# Patient Record
Sex: Male | Born: 1945 | Race: White | Hispanic: No | Marital: Married | Smoking: Former smoker
Health system: Southern US, Community
[De-identification: ages and names within clinical notes are randomized; demographics above are authoritative.]

## PROBLEM LIST (undated history)

## (undated) DIAGNOSIS — I350 Nonrheumatic aortic (valve) stenosis: Secondary | ICD-10-CM

## (undated) DIAGNOSIS — M1990 Unspecified osteoarthritis, unspecified site: Secondary | ICD-10-CM

## (undated) DIAGNOSIS — J189 Pneumonia, unspecified organism: Secondary | ICD-10-CM

## (undated) DIAGNOSIS — R011 Cardiac murmur, unspecified: Secondary | ICD-10-CM

## (undated) DIAGNOSIS — R7303 Prediabetes: Secondary | ICD-10-CM

## (undated) DIAGNOSIS — I10 Essential (primary) hypertension: Secondary | ICD-10-CM

## (undated) DIAGNOSIS — I2510 Atherosclerotic heart disease of native coronary artery without angina pectoris: Secondary | ICD-10-CM

## (undated) DIAGNOSIS — D649 Anemia, unspecified: Secondary | ICD-10-CM

## (undated) DIAGNOSIS — I714 Abdominal aortic aneurysm, without rupture, unspecified: Secondary | ICD-10-CM

## (undated) DIAGNOSIS — K219 Gastro-esophageal reflux disease without esophagitis: Secondary | ICD-10-CM

## (undated) HISTORY — PX: COLONOSCOPY W/ POLYPECTOMY: SHX1380

---
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
## 2015-10-23 ENCOUNTER — Other Ambulatory Visit: Payer: Self-pay | Admitting: Family Medicine

## 2015-10-23 DIAGNOSIS — Z8639 Personal history of other endocrine, nutritional and metabolic disease: Secondary | ICD-10-CM

## 2015-10-23 DIAGNOSIS — IMO0001 Reserved for inherently not codable concepts without codable children: Secondary | ICD-10-CM

## 2015-11-05 ENCOUNTER — Inpatient Hospital Stay: Admission: RE | Admit: 2015-11-05 | Payer: Self-pay | Source: Ambulatory Visit

## 2015-11-20 ENCOUNTER — Ambulatory Visit
Admission: RE | Admit: 2015-11-20 | Discharge: 2015-11-20 | Disposition: A | Discharge status: Home / Self Care | Payer: Medicare HMO | Source: Ambulatory Visit | Attending: Family Medicine | Admitting: Family Medicine

## 2015-11-20 DIAGNOSIS — Z8639 Personal history of other endocrine, nutritional and metabolic disease: Secondary | ICD-10-CM

## 2015-11-20 DIAGNOSIS — IMO0001 Reserved for inherently not codable concepts without codable children: Secondary | ICD-10-CM

## 2015-11-20 MED ORDER — IOPAMIDOL (ISOVUE-300) INJECTION 61%
100.0000 mL | Freq: Once | INTRAVENOUS | Status: AC | PRN
  Administered 2015-11-20: 100 mL via INTRAVENOUS

## 2017-01-25 ENCOUNTER — Other Ambulatory Visit: Payer: Self-pay | Admitting: Family Medicine

## 2017-01-25 DIAGNOSIS — I719 Aortic aneurysm of unspecified site, without rupture: Secondary | ICD-10-CM

## 2017-03-14 ENCOUNTER — Other Ambulatory Visit: Payer: Self-pay | Admitting: Family Medicine

## 2017-03-14 DIAGNOSIS — I714 Abdominal aortic aneurysm, without rupture, unspecified: Secondary | ICD-10-CM

## 2017-03-15 ENCOUNTER — Ambulatory Visit
Admission: RE | Admit: 2017-03-15 | Discharge: 2017-03-15 | Disposition: A | Discharge status: Home / Self Care | Payer: Medicare HMO | Source: Ambulatory Visit | Attending: Family Medicine | Admitting: Family Medicine

## 2017-03-15 ENCOUNTER — Other Ambulatory Visit: Payer: Medicare HMO

## 2017-03-15 DIAGNOSIS — I714 Abdominal aortic aneurysm, without rupture, unspecified: Secondary | ICD-10-CM

## 2017-03-15 MED ORDER — IOPAMIDOL (ISOVUE-300) INJECTION 61%
100.0000 mL | Freq: Once | INTRAVENOUS | Status: AC | PRN
  Administered 2017-03-15: 125 mL via INTRAVENOUS

## 2018-04-26 ENCOUNTER — Other Ambulatory Visit: Payer: Self-pay | Admitting: Family Medicine

## 2018-04-27 ENCOUNTER — Other Ambulatory Visit: Payer: Self-pay | Admitting: Family Medicine

## 2018-04-28 ENCOUNTER — Other Ambulatory Visit: Payer: Self-pay | Admitting: Family Medicine

## 2018-04-28 DIAGNOSIS — I714 Abdominal aortic aneurysm, without rupture, unspecified: Secondary | ICD-10-CM

## 2019-09-26 ENCOUNTER — Other Ambulatory Visit: Payer: Self-pay | Admitting: Family Medicine

## 2019-09-26 DIAGNOSIS — I714 Abdominal aortic aneurysm, without rupture, unspecified: Secondary | ICD-10-CM

## 2019-10-05 ENCOUNTER — Other Ambulatory Visit: Payer: Self-pay | Admitting: Family Medicine

## 2019-10-09 ENCOUNTER — Ambulatory Visit
Admission: RE | Admit: 2019-10-09 | Discharge: 2019-10-09 | Disposition: A | Discharge status: Home / Self Care | Payer: Medicare HMO | Source: Ambulatory Visit | Attending: Family Medicine | Admitting: Family Medicine

## 2019-10-09 DIAGNOSIS — I714 Abdominal aortic aneurysm, without rupture, unspecified: Secondary | ICD-10-CM

## 2020-09-09 DIAGNOSIS — I714 Abdominal aortic aneurysm, without rupture: Secondary | ICD-10-CM | POA: Diagnosis not present

## 2020-09-09 DIAGNOSIS — I10 Essential (primary) hypertension: Secondary | ICD-10-CM | POA: Diagnosis not present

## 2020-09-12 ENCOUNTER — Telehealth: Payer: Self-pay

## 2020-09-12 NOTE — Telephone Encounter (Signed)
NOTES ON FILE FROM  [facility] [phone] SENT REFERRAL TO SCHEDULING...Marland KitchenMarland Kitchen

## 2020-09-16 DIAGNOSIS — I714 Abdominal aortic aneurysm, without rupture: Secondary | ICD-10-CM | POA: Diagnosis not present

## 2020-09-16 NOTE — Progress Notes (Signed)
Cardiology Office Note:    Date:  09/17/2020   ID:  Marcus Webb, DOB 1945-08-08, MRN [id number]  PCP:  Aida Puffer, MD   [facility] Providers Cardiologist:  None {  Referring MD: Aida Puffer, MD    History of Present Illness:    Marcus Webb is a 75 y.o. male with a hx of AAA, Barretts esophagus and HTN who was referred by Dr. Clarene Duke for further evaluation of AAA and HTN.  The patient states he overall feels well. No chest pain, SOB, lightheadedness, dizziness, orthopnea or PND. Prior heavy smoker and quit in 2001. Has been monitored yearly for his AAA and it has been overall stable over the past 10-12 years. No personal history of heart disease, arrhythmia or CHF. Patient is overall active and is able to walk the dog and mow the lawn without issues.   The patient states his blood pressure has been running 160/90-100s in the AM and 130/80s in the evening. Takes all medications in the AM except the coreg which he takes BID. Notably is on both coreg and metoprolol at this time.   Family history: Father with HTN, AAA  No past medical history on file.   Current Medications: Current Meds  Medication Sig   aspirin EC 81 MG tablet Take 1 tablet (81 mg total) by mouth daily. Swallow whole.   carvedilol (COREG) 12.5 MG tablet Take 1 tablet (12.5 mg total) by mouth 2 (two) times daily.   lisinopril (ZESTRIL) 10 MG tablet Take 1 tablet (10 mg total) by mouth at bedtime.   pantoprazole (PROTONIX) 40 MG tablet Take 40 mg by mouth 2 (two) times daily.   [DISCONTINUED] carvedilol (COREG) 6.25 MG tablet Take 6.25 mg by mouth in the morning and at bedtime.   [DISCONTINUED] hydrochlorothiazide (HYDRODIURIL) 25 MG tablet Take 25 mg by mouth daily.   [DISCONTINUED] lisinopril (ZESTRIL) 5 MG tablet Take 5 mg by mouth daily.   [DISCONTINUED] metoprolol succinate (TOPROL-XL) 100 MG 24 hr tablet Take 100 mg by mouth daily.     Allergies:   Patient has no known allergies.   Social History    Socioeconomic History   Marital status: Married    Spouse name: Not on file   Number of children: Not on file   Years of education: Not on file   Highest education level: Not on file  Occupational History   Not on file  Tobacco Use   Smoking status: Former    Types: Cigarettes   Smokeless tobacco: Never  Substance and Sexual Activity   Alcohol use: Not on file   Drug use: Not on file   Sexual activity: Not on file  Other Topics Concern   Not on file  Social History Narrative   Not on file   Social Determinants of Health   Financial Resource Strain: Not on file  Food Insecurity: Not on file  Transportation Needs: Not on file  Physical Activity: Not on file  Stress: Not on file  Social Connections: Not on file     Family History: The patient's family history is not on file.  ROS:   Please see the history of present illness.    Review of Systems  Constitutional:  Negative for chills and fever.  HENT:  Negative for sore throat.   Eyes:  Negative for blurred vision.  Respiratory:  Negative for shortness of breath.   Cardiovascular:  Negative for chest pain, palpitations, orthopnea, claudication, leg swelling and PND.  Gastrointestinal:  Positive  for heartburn. Negative for nausea and vomiting.  Genitourinary:  Negative for hematuria.  Musculoskeletal:  Negative for falls.  Neurological:  Negative for dizziness and loss of consciousness.  Psychiatric/Behavioral:  Negative for substance abuse.   .  EKGs/Labs/Other Studies Reviewed:    The following studies were reviewed today: Abdominal CT Scan 8./2021 FINDINGS: Lower chest: Heart is normal in size. There mitral annulus calcifications. Probable aortic valvular calcifications. No focal airspace disease or pleural effusion.   Hepatobiliary: Mild hepatomegaly with liver spanning 20.9 cm cranial caudal. Diffusely decreased density consistent with steatosis. Suggestion of nodular hepatic contours. No discrete focal  hepatic lesion. No gallstone or pericholecystic inflammation. There is no biliary dilatation.   Pancreas: No ductal dilatation or inflammation.   Spleen: Normal in size without focal abnormality.   Adrenals/Urinary Tract: Bilateral adrenal thickening without dominant adrenal nodule. There is no hydronephrosis. 11 mm lesion from the anterior mid right kidney is too small to accurately characterize, grossly similar in size to prior exam. No renal calculi. Mild wall thickening of the urinary bladder with right lateral bladder diverticulum. No perivesicular fat stranding.   Stomach/Bowel: Small hiatal hernia. There is no small bowel obstruction or inflammation. Multifocal colonic diverticulosis throughout the entire colon, prominent involving the sigmoid. No evidence of diverticulitis. The appendix is not definitively visualized. Small to moderate volume of stool throughout the colon.   Vascular/Lymphatic: Infrarenal aortic aneurysm maximal dimension 3.5 cm, previously 3.3 cm. Moderate aortic atherosclerosis. There is no periaortic stranding. Left common iliac artery measures 15 mm, right common iliac artery measures 16 mm. Moderate iliac atherosclerosis. No bulky abdominopelvic adenopathy. Left IVC, coursing to the right at the level of the renal veins.   Reproductive: Prominent prostate gland spanning 4.8 cm transverse.   Other: Minimal fat in the inguinal canals. No ascites.   Musculoskeletal: Multilevel degenerative change in the spine. Few scattered bone islands in the pelvis are stable from prior.   IMPRESSION: 1. Infrarenal aortic aneurysm maximal dimension 3.5 cm, previously 3.3 cm. There is no periaortic stranding. Recommend follow-up every 2 years. This recommendation follows ACR consensus guidelines: White Paper of the ACR Incidental Findings Committee II on Vascular Findings. J Am Coll Radiol 2013; 10:789-794. 2. Mild hepatomegaly and hepatic steatosis. Suggestion of  nodular hepatic contours, raising concern for cirrhosis. Recommend correlation with cirrhosis risk factors. 3. Colonic diverticulosis without diverticulitis. 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  EKG:  EKG is  ordered today.  The ekg ordered today demonstrates NSR with HR 74  Recent Labs: No results found for requested labs within last 8760 hours.  Recent Lipid Panel No results found for: CHOL, TRIG, HDL, CHOLHDL, VLDL, LDLCALC, LDLDIRECT        Physical Exam:    VS:  BP (!) 144/90   Pulse 74   Ht 5\' 10"  (1.778 m)   Wt 230 lb 12.8 oz (104.7 kg)   SpO2 95%   BMI 33.12 kg/m     Wt Readings from Last 3 Encounters:  09/17/20 230 lb 12.8 oz (104.7 kg)     GEN:  Well nourished, well developed in no acute distress HEENT: Normal NECK: No JVD; No carotid bruits CARDIAC: RRR, 2/6 systolic murmur. No rubs, gallops RESPIRATORY:  Clear to auscultation without rales, wheezing or rhonchi  ABDOMEN: Soft, non-tender, non-distended MUSCULOSKELETAL:  No edema; No deformity  SKIN: Warm and dry NEUROLOGIC:  Alert and oriented x 3 PSYCHIATRIC:  Normal affect   ASSESSMENT:    1. AAA (  abdominal aortic aneurysm) without rupture (HCC)   2. Medication management   3. Hypertension, unspecified type   4. Murmur   5. Hyperlipidemia, unspecified hyperlipidemia type    PLAN:    In order of problems listed above:  #AAA: Infrarenal aortic aneurysm maximal dimension 3.5 cm on CT in 09/2019. Recommended for repeat surveillance every 2 years, however given interval increase in size over the past year, will repeat this year to trend trajectory.  -Continue ASA 81mg  daily -Needs statin; will check lipid panel to determine dosing -Repeat CTA abdomen for surveillance  #HLD: -Will check lipid panel to determine statin dosing  #HTN: Elevated in the AM at 160s/100s, but better controlled at night.  -Continue HCTZ 25mg  daily (will take in the AM) -Increase to coreg to 12.5mg   BID -Increase to lisinopril 10mg  daily (take in the PM) -Monitor blood pressures at home and bring in blood pressure log; Goal BP<120s/80s -Check BMET next week  #Systolic Murmur: -Check TTE   Medication Adjustments/Labs and Tests Ordered: Current medicines are reviewed at length with the patient today.  Concerns regarding medicines are outlined above.  Orders Placed This Encounter  Procedures   CT ANGIO CHEST AORTA W/CM & OR WO/CM   Basic metabolic panel   Lipid Profile   EKG 12-Lead   ECHOCARDIOGRAM COMPLETE    Meds ordered this encounter  Medications   lisinopril (ZESTRIL) 10 MG tablet    Sig: Take 1 tablet (10 mg total) by mouth at bedtime.    Dispense:  90 tablet    Refill:  1    Dose increase   hydrochlorothiazide (HYDRODIURIL) 25 MG tablet    Sig: Take 1 tablet (25 mg total) by mouth daily. Take in the morning time.    Dispense:  90 tablet    Refill:  1   carvedilol (COREG) 12.5 MG tablet    Sig: Take 1 tablet (12.5 mg total) by mouth 2 (two) times daily.    Dispense:  180 tablet    Refill:  1    Dose increase   aspirin EC 81 MG tablet    Sig: Take 1 tablet (81 mg total) by mouth daily. Swallow whole.    Dispense:  90 tablet    Refill:  3     Patient Instructions  Medication Instructions:   STOP TAKING METOPROLOL NOW  START TAKING ASPIRIN 81 MG BY MOUTH DAILY  INCREASE YOUR CARVEDILOL TO 12.5 MG BY MOUTH TWICE DAILY  INCREASE YOUR LISINOPRIL TO 10 MG BY MOUTH DAILY AT BEDTIME  PLEASE TAKE YOUR HYDROCHLOROTHIAZIDE IN THE MORNING TIME  *If you need a refill on your cardiac medications before your next appointment, please call your pharmacy*   Lab Work:  IN ONE WEEK HERE AT THE OFFICE--BMET AND LIPIDS--PLEASE COME FASTING TO THIS LAB APPOINTMENT  If you have labs (blood work) drawn today and your tests are completely normal, you will receive your results only by: MyChart Message (if you have MyChart) OR A paper copy in the mail If you have any lab  test that is abnormal or we need to change your treatment, we will call you to review the results.   Testing/Procedures:  Your physician has requested that you have an echocardiogram. Echocardiography is a painless test that uses sound waves to create images of your heart. It provides your doctor with information about the size and shape of your heart and how well your heart's chambers and valves are working. This procedure takes approximately one hour.  There are no restrictions for this procedure.  CT ANGIO CHEST AORTA TO BE DONE HERE IN THE OFFICE   Follow-Up: At [facility], you and your health needs are our priority.  As part of our continuing mission to provide you with exceptional heart care, we have created designated Provider Care Teams.  These Care Teams include your primary Cardiologist (physician) and Advanced Practice Providers (APPs -  Physician Assistants and Nurse Practitioners) who all work together to provide you with the care you need, when you need it.  We recommend signing up for the patient portal called "MyChart".  Sign up information is provided on this After Visit Summary.  MyChart is used to connect with patients for Virtual Visits (Telemedicine).  Patients are able to view lab/test results, encounter notes, upcoming appointments, etc.  Non-urgent messages can be sent to your provider as well.   To learn more about what you can do with MyChart, go to [email or web address].    Your next appointment:   6 month(s)  The format for your next appointment:   In Person  Provider:   You may see DR. Philomena Buttermore  or one of the following Advanced Practice Providers on your designated Care Team:   Tereso NewcomerScott Weaver, PA-C [place] AusVin Bhagat, [place]      Signed, Meriam SpragueHeather E Iysha Mishkin, MD  09/17/2020 5:24 PM    [facility]

## 2020-09-17 ENCOUNTER — Other Ambulatory Visit: Payer: Self-pay

## 2020-09-17 ENCOUNTER — Ambulatory Visit: Payer: Medicare HMO | Admitting: Cardiology

## 2020-09-17 ENCOUNTER — Encounter (INDEPENDENT_AMBULATORY_CARE_PROVIDER_SITE_OTHER): Payer: Self-pay

## 2020-09-17 ENCOUNTER — Encounter: Payer: Self-pay | Admitting: Cardiology

## 2020-09-17 VITALS — BP 144/90 | HR 74 | Ht 70.0 in | Wt 230.8 lb

## 2020-09-17 DIAGNOSIS — R011 Cardiac murmur, unspecified: Secondary | ICD-10-CM | POA: Diagnosis not present

## 2020-09-17 DIAGNOSIS — I10 Essential (primary) hypertension: Secondary | ICD-10-CM

## 2020-09-17 DIAGNOSIS — Z79899 Other long term (current) drug therapy: Secondary | ICD-10-CM | POA: Diagnosis not present

## 2020-09-17 DIAGNOSIS — E785 Hyperlipidemia, unspecified: Secondary | ICD-10-CM

## 2020-09-17 DIAGNOSIS — I714 Abdominal aortic aneurysm, without rupture, unspecified: Secondary | ICD-10-CM

## 2020-09-17 MED ORDER — CARVEDILOL 12.5 MG PO TABS: 12.5000 mg | ORAL_TABLET | Freq: Two times a day (BID) | ORAL | 1 refills | Status: DC

## 2020-09-17 MED ORDER — LISINOPRIL 10 MG PO TABS: 10.0000 mg | ORAL_TABLET | Freq: Every day | ORAL | 1 refills | Status: DC

## 2020-09-17 MED ORDER — HYDROCHLOROTHIAZIDE 25 MG PO TABS: 25.0000 mg | ORAL_TABLET | Freq: Every day | ORAL | 1 refills | Status: DC

## 2020-09-17 MED ORDER — ASPIRIN EC 81 MG PO TBEC
81.0000 mg | DELAYED_RELEASE_TABLET | Freq: Every day | ORAL | 3 refills | Status: AC
Start: 2020-09-17 — End: ?

## 2020-09-17 NOTE — Patient Instructions (Signed)
Medication Instructions:   STOP TAKING METOPROLOL NOW  START TAKING ASPIRIN 81 MG BY MOUTH DAILY  INCREASE YOUR CARVEDILOL TO 12.5 MG BY MOUTH TWICE DAILY  INCREASE YOUR LISINOPRIL TO 10 MG BY MOUTH DAILY AT BEDTIME  PLEASE TAKE YOUR HYDROCHLOROTHIAZIDE IN THE MORNING TIME  *If you need a refill on your cardiac medications before your next appointment, please call your pharmacy*   Lab Work:  IN ONE WEEK HERE AT THE OFFICE--BMET AND LIPIDS--PLEASE COME FASTING TO THIS LAB APPOINTMENT  If you have labs (blood work) drawn today and your tests are completely normal, you will receive your results only by: MyChart Message (if you have MyChart) OR A paper copy in the mail If you have any lab test that is abnormal or we need to change your treatment, we will call you to review the results.   Testing/Procedures:  Your physician has requested that you have an echocardiogram. Echocardiography is a painless test that uses sound waves to create images of your heart. It provides your doctor with information about the size and shape of your heart and how well your heart's chambers and valves are working. This procedure takes approximately one hour. There are no restrictions for this procedure.  CT ANGIO CHEST AORTA TO BE DONE HERE IN THE OFFICE   Follow-Up: At [facility], you and your health needs are our priority.  As part of our continuing mission to provide you with exceptional heart care, we have created designated Provider Care Teams.  These Care Teams include your primary Cardiologist (physician) and Advanced Practice Providers (APPs -  Physician Assistants and Nurse Practitioners) who all work together to provide you with the care you need, when you need it.  We recommend signing up for the patient portal called "MyChart".  Sign up information is provided on this After Visit Summary.  MyChart is used to connect with patients for Virtual Visits (Telemedicine).  Patients are able to view  lab/test results, encounter notes, upcoming appointments, etc.  Non-urgent messages can be sent to your provider as well.   To learn more about what you can do with MyChart, go to [email or web address].    Your next appointment:   6 month(s)  The format for your next appointment:   In Person  Provider:   You may see DR. PEMBERTON  or one of the following Advanced Practice Providers on your designated Care Team:   Tereso Newcomer, PA-C [place], [place]

## 2020-09-24 ENCOUNTER — Other Ambulatory Visit: Payer: Medicare HMO

## 2020-09-24 ENCOUNTER — Other Ambulatory Visit: Payer: Self-pay

## 2020-09-24 DIAGNOSIS — I10 Essential (primary) hypertension: Secondary | ICD-10-CM

## 2020-09-24 DIAGNOSIS — I714 Abdominal aortic aneurysm, without rupture, unspecified: Secondary | ICD-10-CM

## 2020-09-24 DIAGNOSIS — R011 Cardiac murmur, unspecified: Secondary | ICD-10-CM | POA: Diagnosis not present

## 2020-09-24 DIAGNOSIS — Z79899 Other long term (current) drug therapy: Secondary | ICD-10-CM

## 2020-09-24 DIAGNOSIS — E785 Hyperlipidemia, unspecified: Secondary | ICD-10-CM

## 2020-09-24 LAB — BASIC METABOLIC PANEL
BUN/Creatinine Ratio: 20 (ref 10–24)
BUN: 15 mg/dL (ref 8–27)
CO2: 26 mmol/L (ref 20–29)
Calcium: 9.1 mg/dL (ref 8.6–10.2)
Chloride: 102 mmol/L (ref 96–106)
Creatinine, Ser: 0.75 mg/dL — ABNORMAL LOW (ref 0.76–1.27)
Glucose: 92 mg/dL (ref 65–99)
Potassium: 4 mmol/L (ref 3.5–5.2)
Sodium: 144 mmol/L (ref 134–144)
eGFR: 95 mL/min/{1.73_m2} (ref >59)

## 2020-09-24 LAB — LIPID PANEL
Chol/HDL Ratio: 5.3 ratio — ABNORMAL HIGH (ref 0.0–5.0)
Cholesterol, Total: 171 mg/dL (ref 100–199)
HDL: 32 mg/dL — ABNORMAL LOW (ref >39)
LDL Chol Calc (NIH): 92 mg/dL (ref 0–99)
Triglycerides: 278 mg/dL — ABNORMAL HIGH (ref 0–149)
VLDL Cholesterol Cal: 47 mg/dL — ABNORMAL HIGH (ref 5–40)

## 2020-09-25 ENCOUNTER — Other Ambulatory Visit: Payer: Self-pay | Admitting: *Deleted

## 2020-09-25 ENCOUNTER — Telehealth: Payer: Self-pay | Admitting: Cardiology

## 2020-09-25 DIAGNOSIS — E785 Hyperlipidemia, unspecified: Secondary | ICD-10-CM

## 2020-09-25 MED ORDER — ROSUVASTATIN CALCIUM 10 MG PO TABS
10.0000 mg | ORAL_TABLET | Freq: Every day | ORAL | 3 refills | Status: DC
Start: 2020-09-25 — End: 2020-11-27

## 2020-09-25 NOTE — Telephone Encounter (Signed)
Meriam Sprague, MD  P Cv Div Ch St Triage His kidney function and electrolytes look great. His cholesterol and triglycerides are elevated and above goal (goal <70 for LDL; goal TG<200). Can we start him on crestor 10mg  daily and repeat lipids in 6-8 weeks.

## 2020-09-25 NOTE — Telephone Encounter (Signed)
Pt aware of lab results ./cy 

## 2020-09-25 NOTE — Telephone Encounter (Signed)
Follow Up:      Patient is returning Christine's call from today, concerning his lab results.

## 2020-09-26 ENCOUNTER — Telehealth: Payer: Self-pay | Admitting: Cardiology

## 2020-09-26 NOTE — Telephone Encounter (Signed)
Pt states he received a text from his Insurance Carrier  this morning saying that one or both of his tests scheduled for 10/01/20  has been  denied. Pt is not sure of which test is denied but insurance carrier stated they've already faxed the denial to heart care. Please advise pt further

## 2020-09-26 NOTE — Telephone Encounter (Signed)
Pt calling to see if he will be pre-certified for his echo and CT next week. His insurance, [employer], notified him that it was denied. He would like a call back from the pre-cert department if it cannot be approved.

## 2020-10-01 ENCOUNTER — Ambulatory Visit (INDEPENDENT_AMBULATORY_CARE_PROVIDER_SITE_OTHER)
Admission: RE | Admit: 2020-10-01 | Discharge: 2020-10-01 | Disposition: A | Discharge status: Home / Self Care | Payer: Medicare HMO | Source: Ambulatory Visit | Attending: Cardiology | Admitting: Cardiology

## 2020-10-01 ENCOUNTER — Ambulatory Visit (HOSPITAL_COMMUNITY): Payer: Medicare HMO | Attending: Cardiovascular Disease

## 2020-10-01 ENCOUNTER — Other Ambulatory Visit: Payer: Self-pay

## 2020-10-01 DIAGNOSIS — I714 Abdominal aortic aneurysm, without rupture, unspecified: Secondary | ICD-10-CM

## 2020-10-01 DIAGNOSIS — R011 Cardiac murmur, unspecified: Secondary | ICD-10-CM | POA: Insufficient documentation

## 2020-10-01 DIAGNOSIS — J432 Centrilobular emphysema: Secondary | ICD-10-CM | POA: Diagnosis not present

## 2020-10-01 DIAGNOSIS — I712 Thoracic aortic aneurysm, without rupture: Secondary | ICD-10-CM | POA: Diagnosis not present

## 2020-10-01 LAB — ECHOCARDIOGRAM COMPLETE
AR max vel: 1.7 cm2
AV Area VTI: 1.7 cm2
AV Area mean vel: 1.7 cm2
AV Mean grad: 17 mmHg
AV Peak grad: 30.6 mmHg
Ao pk vel: 2.77 m/s
Area-P 1/2: 2.42 cm2
P 1/2 time: 527 msec
S' Lateral: 2.9 cm

## 2020-10-01 MED ORDER — IOHEXOL 350 MG/ML SOLN
100.0000 mL | Freq: Once | INTRAVENOUS | Status: AC | PRN
  Administered 2020-10-01: 100 mL via INTRAVENOUS

## 2020-10-08 ENCOUNTER — Telehealth: Payer: Self-pay | Admitting: *Deleted

## 2020-10-08 NOTE — Telephone Encounter (Signed)
-----   Message from Francine Graven sent at 10/08/2020  8:18 AM EDT ----- Regarding: RE: AUTH DENIAL Good Morning,   The denial was approved and I documented auth for this pt on 09/29/20. The procedure was performed on 10/01/20.  Thanks, Hermine Messick ----- Message ----- From: Loa Socks, LPN Sent: 5/44/9201   8:03 AM EDT To: Francine Graven, # Subject: FW: AUTH DENIAL                                This pt had a recent echo done.  Would that be an acceptable image?  Thanks  Lajoyce Corners ' ----- Message ----- From: Francine Graven Sent: 09/26/2020  10:52 AM EDT To: Loa Socks, LPN, Meriam Sprague, MD Subject: Estrella Deeds Morning,   Pt's CTA Berkley Harvey has been denied. I faxed an appeal along with last EKG results. I will keep you posted on outcome. Following are denial reasons:  Based on [employer] Peripheral Vascular Disease Imaging Guidelines Section(s): PVD 6.2 Thoracic  Aortic Aneurysm (TAA), we cannot approve this request. Your healthcare provider told us that  you have or may have blood vessel problems. The request cannot be approved because:  You must have had other picture studies that are not normal (such as an x-ray).  You should share a copy of this decision with your doctor so you and your doctor can discuss  next steps. If your doctor requested coverage on your behalf, we have sent a copy of this decision  to your doctor.  Thank you,  Anisah

## 2020-10-09 ENCOUNTER — Telehealth: Payer: Self-pay | Admitting: *Deleted

## 2020-10-09 NOTE — Telephone Encounter (Signed)
Pt dropped off several BP and HR recordings by the office a week ago.  Looked at the recordings and had medical recordings scan these in his chart, being there were so many.  Pts recordings look stable and within normal limits for him.  No acute numbers noted.  He should continue his current regimen.  Called the pt to inform him that his readings look nice and stable and he should continue his current treatment regimen, but he did not answer the phone. Did leave him a detailed message on his confirmed voicemail that his BP readings look good, and he should continue his current med regimen.  Left him a detailed message to call the office back with any additional questions or concerns regarding message left.

## 2020-11-26 ENCOUNTER — Other Ambulatory Visit: Payer: Medicare HMO | Admitting: *Deleted

## 2020-11-26 ENCOUNTER — Other Ambulatory Visit: Payer: Self-pay

## 2020-11-26 DIAGNOSIS — E785 Hyperlipidemia, unspecified: Secondary | ICD-10-CM

## 2020-11-26 LAB — LIPID PANEL
Chol/HDL Ratio: 4 ratio (ref 0.0–5.0)
Cholesterol, Total: 116 mg/dL (ref 100–199)
HDL: 29 mg/dL — ABNORMAL LOW (ref >39)
LDL Chol Calc (NIH): 53 mg/dL (ref 0–99)
Triglycerides: 210 mg/dL — ABNORMAL HIGH (ref 0–149)
VLDL Cholesterol Cal: 34 mg/dL (ref 5–40)

## 2020-11-27 ENCOUNTER — Telehealth: Payer: Self-pay | Admitting: *Deleted

## 2020-11-27 DIAGNOSIS — Z79899 Other long term (current) drug therapy: Secondary | ICD-10-CM

## 2020-11-27 DIAGNOSIS — E785 Hyperlipidemia, unspecified: Secondary | ICD-10-CM

## 2020-11-27 MED ORDER — ROSUVASTATIN CALCIUM 20 MG PO TABS: 20.0000 mg | ORAL_TABLET | Freq: Every day | ORAL | 2 refills | Status: DC

## 2020-11-27 NOTE — Telephone Encounter (Signed)
-----   Message from Parke Poisson, MD sent at 11/27/2020  3:49 PM EDT ----- LDL looks great on crestor 10 mg daily however triglycerides still suboptimal.  We can increase crestor to 20 mg daily, or try to add fenofibrate 160 mg to crestor 10 mg daily. Patient's choice, if discussion needed please make appt with [facility] lipid clinic. Labs in 8-12  weeks, LFTs and lipids

## 2020-11-27 NOTE — Telephone Encounter (Signed)
Pt aware of lab results and recommendations per Dr. Jacques Navy (covering for Dr. Shari Prows this week). Pt agreed to taking increased dose of crestor 20 mg po daily and coming in for repeat lipids and LFTs in 8 weeks.  Confirmed the pharmacy of choice with the pt. Scheduled the pt to come in for repeat labs on 01/26/21.  He is aware to come fasting to this lab appt.  Pt verbalized understanding and agrees with this plan.

## 2021-01-26 ENCOUNTER — Other Ambulatory Visit: Payer: Self-pay

## 2021-01-26 ENCOUNTER — Other Ambulatory Visit: Payer: Medicare HMO | Admitting: *Deleted

## 2021-01-26 DIAGNOSIS — Z79899 Other long term (current) drug therapy: Secondary | ICD-10-CM

## 2021-01-26 DIAGNOSIS — E785 Hyperlipidemia, unspecified: Secondary | ICD-10-CM

## 2021-01-26 LAB — LIPID PANEL
Chol/HDL Ratio: 3.3 ratio (ref 0.0–5.0)
Cholesterol, Total: 96 mg/dL — ABNORMAL LOW (ref 100–199)
HDL: 29 mg/dL — ABNORMAL LOW (ref >39)
LDL Chol Calc (NIH): 42 mg/dL (ref 0–99)
Triglycerides: 146 mg/dL (ref 0–149)
VLDL Cholesterol Cal: 25 mg/dL (ref 5–40)

## 2021-01-26 LAB — HEPATIC FUNCTION PANEL
ALT: 32 IU/L (ref 0–44)
AST: 22 IU/L (ref 0–40)
Albumin: 4.3 g/dL (ref 3.7–4.7)
Alkaline Phosphatase: 47 IU/L (ref 44–121)
Bilirubin Total: 0.4 mg/dL (ref 0.0–1.2)
Bilirubin, Direct: 0.15 mg/dL (ref 0.00–0.40)
Total Protein: 6.2 g/dL (ref 6.0–8.5)

## 2021-03-10 NOTE — Progress Notes (Deleted)
Cardiology Office Note:    Date:  03/10/2021   ID:  Marcus Webb, DOB [birth year], MRN [id number]  PCP:  Tamsen Roers, MD   [facility] Providers Cardiologist:  None {  Referring MD: Tamsen Roers, MD    History of Present Illness:    Marcus Webb is a 76 y.o. male with a hx of AAA, Barretts esophagus and HTN who presents to clinic for follow-up.  Patient initially seen on 08/2020 for AAA. Blood pressures at that time running high in the AM. We increased his coreg and lisinopril at that time. 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  Today, ***   Family history: Father with HTN, AAA  No past medical history on file.   Current Medications: No outpatient medications have been marked as taking for the 03/12/21 encounter (Appointment) with Marcus Bergeron, MD.     Allergies:   Patient has no known allergies.   Social History   Socioeconomic History   Marital status: Married    Spouse name: Not on file   Number of children: Not on file   Years of education: Not on file   Highest education level: Not on file  Occupational History   Not on file  Tobacco Use   Smoking status: Former    Types: Cigarettes   Smokeless tobacco: Never  Substance and Sexual Activity   Alcohol use: Not on file   Drug use: Not on file   Sexual activity: Not on file  Other Topics Concern   Not on file  Social History Narrative   Not on file   Social Determinants of Health   Financial Resource Strain: Not on file  Food Insecurity: Not on file  Transportation Needs: Not on file  Physical Activity: Not on file  Stress: Not on file  Social Connections: Not on file     Family History: The patient's family history is not on file.  ROS:   Please see the history of present illness.    Review of Systems  Constitutional:  Negative for chills and fever.  HENT:  Negative for sore throat.   Eyes:  Negative for blurred vision.  Respiratory:  Negative for  shortness of breath.   Cardiovascular:  Negative for chest pain, palpitations, orthopnea, claudication, leg swelling and PND.  Gastrointestinal:  Positive for heartburn. Negative for nausea and vomiting.  Genitourinary:  Negative for hematuria.  Musculoskeletal:  Negative for falls.  Neurological:  Negative for dizziness and loss of consciousness.  Psychiatric/Behavioral:  Negative for substance abuse.   .  EKGs/Labs/Other Studies Reviewed:    The following studies were reviewed today: CTA chest 10/01/20: FINDINGS: Cardiovascular: Conventional 3 vessel arch anatomy. The aortic root, ascending, transverse and descending thoracic aorta are all normal in caliber. Scattered atherosclerotic plaque present throughout. Thickened and calcified aortic valve. The heart is normal in size. Extensive atherosclerotic calcifications along the courses of the coronary arteries. No pericardial effusion. Small 1.9 cm water attenuation lesion adjacent to the pericardium at the level of the ascending aorta (image 45 series 4).   Mediastinum/Nodes: No enlarged mediastinal, hilar, or axillary lymph nodes. Thyroid gland, trachea, and esophagus demonstrate no significant findings.   Lungs/Pleura: Lungs are clear. No pleural effusion or pneumothorax. Mild centrilobular pulmonary emphysema.   Upper Abdomen: No acute abnormality. 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 No significant interval change dating back to in January of 2019.   Musculoskeletal: No chest  wall abnormality. No acute or significant osseous findings.   Review of the MIP images confirms the above findings.   IMPRESSION: 1. No evidence of thoracic aortic aneurysm. 2. 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 3. Thickened and calcified aortic valve. 4.  Mild centrilobular pulmonary emphysema. 5. 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   Abdominal CT Scan 8./2021 FINDINGS: Lower chest: Heart is normal in size. There mitral annulus calcifications. Probable aortic valvular calcifications. No focal airspace disease or pleural effusion.   Hepatobiliary: Mild hepatomegaly with liver spanning 20.9 cm cranial caudal. Diffusely decreased density consistent with steatosis. Suggestion of nodular hepatic contours. No discrete focal hepatic lesion. No gallstone or pericholecystic inflammation. There is no biliary dilatation.   Pancreas: No ductal dilatation or inflammation.   Spleen: Normal in size without focal abnormality.   Adrenals/Urinary Tract: Bilateral adrenal thickening without dominant adrenal nodule. There is no hydronephrosis. 11 mm lesion from the anterior mid right kidney is too small to accurately characterize, grossly similar in size to prior exam. No renal calculi. Mild wall thickening of the urinary bladder with right lateral bladder diverticulum. No perivesicular fat stranding.   Stomach/Bowel: Small hiatal hernia. There is no small bowel obstruction or inflammation. Multifocal colonic diverticulosis throughout the entire colon, prominent involving the sigmoid. No evidence of diverticulitis. The appendix is not definitively visualized. Small to moderate volume of stool throughout the colon.   Vascular/Lymphatic: Infrarenal aortic aneurysm maximal dimension 3.5 cm, previously 3.3 cm. Moderate aortic atherosclerosis. There is no periaortic stranding. Left common iliac artery measures 15 mm, right common iliac artery measures 16 mm. Moderate iliac atherosclerosis. No bulky abdominopelvic adenopathy. Left IVC, coursing to the right at the level of the renal veins.   Reproductive: Prominent prostate gland spanning 4.8 cm transverse.   Other: Minimal fat in the inguinal  canals. No ascites.   Musculoskeletal: Multilevel degenerative change in the spine. Few scattered bone islands in the pelvis are stable from prior.   IMPRESSION: 1. Infrarenal aortic aneurysm maximal dimension 3.5 cm, previously 3.3 cm. There is no periaortic stranding. Recommend follow-up every 2 years. This recommendation follows ACR consensus guidelines: White Paper of the ACR Incidental Findings Committee II on Vascular Findings. J Am Coll Radiol 2013; 10:789-794. 2. Mild hepatomegaly and hepatic steatosis. Suggestion of nodular hepatic contours, raising concern for cirrhosis. Recommend correlation with cirrhosis risk factors. 3. Colonic diverticulosis without diverticulitis. 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  EKG:  EKG is  ordered today.  The ekg ordered today demonstrates NSR with HR 74  Recent Labs: 09/24/2020: BUN 15; Creatinine, Ser 0.75; Potassium 4.0; Sodium 144 01/26/2021: ALT 32  Recent Lipid Panel    Component Value Date/Time   CHOL 96 (L) 01/26/2021 0849   TRIG 146 01/26/2021 0849   HDL 29 (L) 01/26/2021 0849   CHOLHDL 3.3 01/26/2021 0849   LDLCALC 42 01/26/2021 0849          Physical Exam:    VS:  There were no vitals taken for this visit.    Wt Readings from Last 3 Encounters:  09/17/20 230 lb 12.8 oz (104.7 kg)     GEN:  Well nourished, well developed in no acute distress HEENT: Normal NECK: No JVD; No carotid bruits CARDIAC: RRR, 2/6 systolic murmur. No rubs, gallops RESPIRATORY:  Clear to  auscultation without rales, wheezing or rhonchi  ABDOMEN: Soft, non-tender, non-distended MUSCULOSKELETAL:  No edema; No deformity  SKIN: Warm and dry NEUROLOGIC:  Alert and oriented x 3 PSYCHIATRIC:  Normal affect   ASSESSMENT:    No diagnosis found.  PLAN:    In order of problems listed above:  #AAA: Infrarenal aortic aneurysm maximal dimension 3.5 cm on CT in 09/2019. Recommended for repeat surveillance every 2 years, however  given interval increase in size over the past year, will repeat this year to trend trajectory.  -Continue ASA 81mg  daily -Continue crestor 20mg  daily -Repeat CTA abdomen for surveillance  #HLD: -Continue crestor 20mg  daily  #HTN: Elevated in the AM at 160s/100s, but better controlled at night.  -Continue HCTZ 25mg  daily (will take in the AM) -Continue coreg 12.5mg  BID -Continue lisinopril 10mg  daily (take in the PM) -Monitor blood pressures at home and bring in blood pressure log; Goal BP<120s/80s  #Mild to moderate aortic stenosis:  #CAD:    Medication Adjustments/Labs and Tests Ordered: Current medicines are reviewed at length with the patient today.  Concerns regarding medicines are outlined above.  No orders of the defined types were placed in this encounter.   No orders of the defined types were placed in this encounter.    There are no Patient Instructions on file for this visit.    Signed, Marcus Bergeron, MD  03/10/2021 12:08 PM    [place]

## 2021-03-12 ENCOUNTER — Ambulatory Visit: Payer: Medicare HMO | Admitting: Cardiology

## 2021-03-12 ENCOUNTER — Other Ambulatory Visit: Payer: Self-pay

## 2021-03-12 ENCOUNTER — Encounter: Payer: Self-pay | Admitting: Cardiology

## 2021-03-12 VITALS — BP 136/78 | HR 66 | Ht 70.0 in | Wt 238.8 lb

## 2021-03-12 DIAGNOSIS — E785 Hyperlipidemia, unspecified: Secondary | ICD-10-CM

## 2021-03-12 DIAGNOSIS — R011 Cardiac murmur, unspecified: Secondary | ICD-10-CM | POA: Diagnosis not present

## 2021-03-12 DIAGNOSIS — I700 Atherosclerosis of aorta: Secondary | ICD-10-CM

## 2021-03-12 DIAGNOSIS — I350 Nonrheumatic aortic (valve) stenosis: Secondary | ICD-10-CM

## 2021-03-12 DIAGNOSIS — I2510 Atherosclerotic heart disease of native coronary artery without angina pectoris: Secondary | ICD-10-CM | POA: Diagnosis not present

## 2021-03-12 DIAGNOSIS — I10 Essential (primary) hypertension: Secondary | ICD-10-CM

## 2021-03-12 DIAGNOSIS — I7141 Pararenal abdominal aortic aneurysm, without rupture: Secondary | ICD-10-CM | POA: Diagnosis not present

## 2021-03-12 NOTE — Patient Instructions (Signed)
Medication Instructions:   Your physician recommends that you continue on your current medications as directed. Please refer to the Current Medication list given to you today.  *If you need a refill on your cardiac medications before your next appointment, please call your pharmacy*   Testing/Procedures:  CT ABDOMEN AND PELVIS W/WO CONTRAST--NEEDS TO BE SCHEDULED TO HAVE DONE IN MARCH 2023 PER DR. Shari Prows   Follow-Up: At [facility], you and your health needs are our priority.  As part of our continuing mission to provide you with exceptional heart care, we have created designated Provider Care Teams.  These Care Teams include your primary Cardiologist (physician) and Advanced Practice Providers (APPs -  Physician Assistants and Nurse Practitioners) who all work together to provide you with the care you need, when you need it.  We recommend signing up for the patient portal called "MyChart".  Sign up information is provided on this After Visit Summary.  MyChart is used to connect with patients for Virtual Visits (Telemedicine).  Patients are able to view lab/test results, encounter notes, upcoming appointments, etc.  Non-urgent messages can be sent to your provider as well.   To learn more about what you can do with MyChart, go to [email or web address].    Your next appointment:   6 month(s)  The format for your next appointment:   In Person  Provider:   DR. Shari Prows

## 2021-03-12 NOTE — Progress Notes (Signed)
Cardiology Office Note:    Date:  03/12/2021   ID:  Marcus Webb, DOB 04/01/1945, MRN [id number]  PCP:  Tamsen Roers, MD   [facility] Providers Cardiologist:  None {  Referring MD: Tamsen Roers, MD    History of Present Illness:    Marcus Webb is a 76 y.o. male with a hx of AAA, Barretts esophagus and HTN who presents to clinic for follow-up.  Patient initially seen on 08/2020 for AAA. Blood pressures at that time running high in the AM. We increased his coreg and lisinopril at that time. 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 Has coronary calcifications as well as aortic valve calcification.  Today, he is doing fine with no major concerns. We discussed to results of his chest CT. At home, his blood pressure is staying in the 0000000 systolic range. He is complaint with his medications. He is not currently active but is trying to walk more by shopping and walking his dogs. This past week, he started [employer]. The program worked for him in the past.   The patient denies chest pain, chest pressure, dyspnea at rest or with exertion, palpitations, PND, orthopnea, or leg swelling. Denies cough, fever, chills. Denies nausea, vomiting. Denies syncope or presyncope. Denies dizziness or lightheadedness. Denies snoring.  Family history: Father with HTN, AAA  No past medical history on file.   Current Medications: Current Meds  Medication Sig   aspirin EC 81 MG tablet Take 1 tablet (81 mg total) by mouth daily. Swallow whole.   carvedilol (COREG) 12.5 MG tablet Take 1 tablet (12.5 mg total) by mouth 2 (two) times daily.   hydrochlorothiazide (HYDRODIURIL) 25 MG tablet Take 1 tablet (25 mg total) by mouth daily. Take in the morning time.   lisinopril (ZESTRIL) 10 MG tablet Take 1 tablet (10 mg total) by mouth at bedtime.   pantoprazole (PROTONIX) 40 MG tablet Take 40 mg by mouth 2 (two) times daily.   rosuvastatin (CRESTOR) 20 MG tablet Take 1 tablet  (20 mg total) by mouth daily.     Allergies:   Patient has no known allergies.   Social History   Socioeconomic History   Marital status: Married    Spouse name: Not on file   Number of children: Not on file   Years of education: Not on file   Highest education level: Not on file  Occupational History   Not on file  Tobacco Use   Smoking status: Former    Types: Cigarettes   Smokeless tobacco: Never  Substance and Sexual Activity   Alcohol use: Not on file   Drug use: Not on file   Sexual activity: Not on file  Other Topics Concern   Not on file  Social History Narrative   Not on file   Social Determinants of Health   Financial Resource Strain: Not on file  Food Insecurity: Not on file  Transportation Needs: Not on file  Physical Activity: Not on file  Stress: Not on file  Social Connections: Not on file     Family History: The patient's family history is not on file.  ROS:   Please see the history of present illness.    Review of Systems  Constitutional:  Negative for chills and fever.  HENT:  Negative for sore throat.   Eyes:  Negative for blurred vision.  Respiratory:  Negative for shortness of breath.   Cardiovascular:  Negative for chest pain, palpitations, orthopnea, claudication, leg swelling  and PND.  Gastrointestinal:  Negative for heartburn, nausea and vomiting.  Genitourinary:  Negative for hematuria.  Musculoskeletal:  Negative for falls.  Neurological:  Negative for dizziness and loss of consciousness.  Psychiatric/Behavioral:  Negative for substance abuse.   .  EKGs/Labs/Other Studies Reviewed:    The following studies were reviewed today: CTA chest 10/01/20: FINDINGS: Cardiovascular: Conventional 3 vessel arch anatomy. The aortic root, ascending, transverse and descending thoracic aorta are all normal in caliber. Scattered atherosclerotic plaque present throughout. Thickened and calcified aortic valve. The heart is normal in  size. Extensive atherosclerotic calcifications along the courses of the coronary arteries. No pericardial effusion. Small 1.9 cm water attenuation lesion adjacent to the pericardium at the level of the ascending aorta (image 45 series 4).   Mediastinum/Nodes: No enlarged mediastinal, hilar, or axillary lymph nodes. Thyroid gland, trachea, and esophagus demonstrate no significant findings.   Lungs/Pleura: Lungs are clear. No pleural effusion or pneumothorax. Mild centrilobular pulmonary emphysema.   Upper Abdomen: No acute abnormality. 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 No significant interval change dating back to in January of 2019.   Musculoskeletal: No chest wall abnormality. No acute or significant osseous findings.   Review of the MIP images confirms the above findings.   IMPRESSION: 1. No evidence of thoracic aortic aneurysm. 2. 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 3. Thickened and calcified aortic valve. 4. Mild centrilobular pulmonary emphysema. 5. 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   Abdominal CT Scan 8./2021 FINDINGS: Lower chest: Heart is normal in size. There mitral annulus calcifications. Probable aortic valvular calcifications. No focal airspace disease or pleural effusion.   Hepatobiliary: Mild hepatomegaly with liver spanning 20.9 cm cranial caudal. Diffusely decreased density consistent with steatosis. Suggestion of nodular hepatic contours. No discrete focal hepatic lesion. No gallstone or pericholecystic inflammation. There is no biliary dilatation.   Pancreas: No ductal dilatation or inflammation.   Spleen: Normal in size without focal abnormality.   Adrenals/Urinary Tract: Bilateral adrenal  thickening without dominant adrenal nodule. There is no hydronephrosis. 11 mm lesion from the anterior mid right kidney is too small to accurately characterize, grossly similar in size to prior exam. No renal calculi. Mild wall thickening of the urinary bladder with right lateral bladder diverticulum. No perivesicular fat stranding.   Stomach/Bowel: Small hiatal hernia. There is no small bowel obstruction or inflammation. Multifocal colonic diverticulosis throughout the entire colon, prominent involving the sigmoid. No evidence of diverticulitis. The appendix is not definitively visualized. Small to moderate volume of stool throughout the colon.   Vascular/Lymphatic: Infrarenal aortic aneurysm maximal dimension 3.5 cm, previously 3.3 cm. Moderate aortic atherosclerosis. There is no periaortic stranding. Left common iliac artery measures 15 mm, right common iliac artery measures 16 mm. Moderate iliac atherosclerosis. No bulky abdominopelvic adenopathy. Left IVC, coursing to the right at the level of the renal veins.   Reproductive: Prominent prostate gland spanning 4.8 cm transverse.   Other: Minimal fat in the inguinal canals. No ascites.   Musculoskeletal: Multilevel degenerative change in the spine. Few scattered bone islands in the pelvis are stable from prior.   IMPRESSION: 1. Infrarenal aortic aneurysm maximal dimension 3.5 cm, previously 3.3 cm. There is no periaortic stranding. Recommend follow-up every 2 years. This recommendation follows ACR consensus guidelines: White  Paper of the ACR Incidental Findings Committee II on Vascular Findings. J Am Coll Radiol 2013; 10:789-794. 2. Mild hepatomegaly and hepatic steatosis. Suggestion of nodular hepatic contours, raising concern for cirrhosis. Recommend correlation with cirrhosis risk factors. 3. Colonic diverticulosis without diverticulitis. 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  EKG:  EKG was not ordered  today 09/17/20: NSR with HR 74  Recent Labs: 09/24/2020: BUN 15; Creatinine, Ser 0.75; Potassium 4.0; Sodium 144 01/26/2021: ALT 32  Recent Lipid Panel    Component Value Date/Time   CHOL 96 (L) 01/26/2021 0849   TRIG 146 01/26/2021 0849   HDL 29 (L) 01/26/2021 0849   CHOLHDL 3.3 01/26/2021 0849   LDLCALC 42 01/26/2021 0849          Physical Exam:    VS:  BP 136/78    Pulse 66    Ht 5\' 10"  (1.778 m)    Wt 238 lb 12.8 oz (108.3 kg)    SpO2 96%    BMI 34.26 kg/m     Wt Readings from Last 3 Encounters:  03/12/21 238 lb 12.8 oz (108.3 kg)  09/17/20 230 lb 12.8 oz (104.7 kg)     GEN:  Well nourished, well developed in no acute distress HEENT: Normal NECK: No JVD; No carotid bruits CARDIAC: RRR, 2/6 systolic murmur. No rubs, gallops RESPIRATORY:  Clear to auscultation without rales, wheezing or rhonchi  ABDOMEN: Soft, non-tender, non-distended MUSCULOSKELETAL:  No edema; No deformity  SKIN: Warm and dry NEUROLOGIC:  Alert and oriented x 3 PSYCHIATRIC:  Normal affect   ASSESSMENT:    1. 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 Hyperlipidemia, unspecified hyperlipidemia type   3. Hypertension, unspecified type   4. Murmur   5. Coronary artery disease involving native coronary artery of native heart without angina pectoris   6. Moderate aortic stenosis   7. Aortic atherosclerosis (HCC)     PLAN:    In order of problems listed above:  #AAA: Infrarenal aortic aneurysm maximal dimension 3.5 cm on CT in 09/2019. -Continue ASA 81mg  daily -Continue crestor 20mg  daily -Repeat CTA abdomen for surveillance  #Coronary Artery Disease: #Aortic Atherosclerosis: CT chest with extensive coronary calcification. No current anginal symptoms. -Continue ASA 81mg  daily -Continue crestor 20mg  daily -Continue coreg 12.5mg  BID -Continue lisinopril 10mg  daily  #HLD: LDL 42. Goal <70 -Continue crestor 20mg  daily  #HTN: Much better controlled and mainly at  goal in 120s.  -Continue HCTZ 25mg  daily -Continue coreg 12.5mg  BID -Continue lisinopril 10mg  daily -Goal <120/80s  #Mild-to-moderate AS: Noted on TTE in 09/2020. -Will need repeat TTE every 1-2 years    Medication Adjustments/Labs and Tests Ordered: Current medicines are reviewed at length with the patient today.  Concerns regarding medicines are outlined above.  Orders Placed This Encounter  Procedures   CT ABDOMEN PELVIS W WO CONTRAST   Basic metabolic panel    No orders of the defined types were placed in this encounter.    Patient Instructions  Medication Instructions:   Your physician recommends that you continue on your current medications as directed. Please refer to the Current Medication list given to you today.  *If you need a refill on your cardiac medications before your next appointment, please call your pharmacy*   Testing/Procedures:  CT ABDOMEN AND PELVIS W/WO CONTRAST--NEEDS TO BE SCHEDULED TO HAVE DONE IN MARCH 2023 PER DR. Johney Frame   Follow-Up: At [facility], you and your health needs are our priority.  As  part of our continuing mission to provide you with exceptional heart care, we have created designated Provider Care Teams.  These Care Teams include your primary Cardiologist (physician) and Advanced Practice Providers (APPs -  Physician Assistants and Nurse Practitioners) who all work together to provide you with the care you need, when you need it.  We recommend signing up for the patient portal called "MyChart".  Sign up information is provided on this After Visit Summary.  MyChart is used to connect with patients for Virtual Visits (Telemedicine).  Patients are able to view lab/test results, encounter notes, upcoming appointments, etc.  Non-urgent messages can be sent to your provider as well.   To learn more about what you can do with MyChart, go to [email or web address].    Your next appointment:   6 month(s)  The format for your next  appointment:   In Person  Provider:   DR. Reece Leader as a scribe for Freada Bergeron, MD.,have documented all relevant documentation on the behalf of Freada Bergeron, MD,as directed by  Freada Bergeron, MD while in the presence of Freada Bergeron, MD.  I, Freada Bergeron, MD, have reviewed all documentation for this visit. The documentation on 03/12/21 for the exam, diagnosis, procedures, and orders are all accurate and complete.   Signed, Freada Bergeron, MD  03/12/2021 12:59 PM    [place]

## 2021-03-26 ENCOUNTER — Other Ambulatory Visit: Payer: Self-pay

## 2021-03-26 DIAGNOSIS — I10 Essential (primary) hypertension: Secondary | ICD-10-CM

## 2021-03-26 DIAGNOSIS — E785 Hyperlipidemia, unspecified: Secondary | ICD-10-CM

## 2021-03-26 DIAGNOSIS — R011 Cardiac murmur, unspecified: Secondary | ICD-10-CM

## 2021-03-26 DIAGNOSIS — I714 Abdominal aortic aneurysm, without rupture, unspecified: Secondary | ICD-10-CM

## 2021-03-26 DIAGNOSIS — Z79899 Other long term (current) drug therapy: Secondary | ICD-10-CM

## 2021-03-26 MED ORDER — CARVEDILOL 12.5 MG PO TABS: 12.5000 mg | ORAL_TABLET | Freq: Two times a day (BID) | ORAL | 3 refills | Status: DC

## 2021-04-16 ENCOUNTER — Other Ambulatory Visit: Payer: Self-pay | Admitting: *Deleted

## 2021-04-16 DIAGNOSIS — Z79899 Other long term (current) drug therapy: Secondary | ICD-10-CM

## 2021-04-16 DIAGNOSIS — E785 Hyperlipidemia, unspecified: Secondary | ICD-10-CM

## 2021-04-16 DIAGNOSIS — I714 Abdominal aortic aneurysm, without rupture, unspecified: Secondary | ICD-10-CM

## 2021-04-16 DIAGNOSIS — R011 Cardiac murmur, unspecified: Secondary | ICD-10-CM

## 2021-04-16 DIAGNOSIS — I10 Essential (primary) hypertension: Secondary | ICD-10-CM

## 2021-04-16 MED ORDER — LISINOPRIL 10 MG PO TABS: 10.0000 mg | ORAL_TABLET | Freq: Every day | ORAL | 1 refills | Status: DC

## 2021-04-23 ENCOUNTER — Other Ambulatory Visit: Payer: Self-pay

## 2021-04-23 ENCOUNTER — Other Ambulatory Visit: Payer: Medicare HMO

## 2021-04-23 DIAGNOSIS — I7141 Pararenal abdominal aortic aneurysm, without rupture: Secondary | ICD-10-CM

## 2021-04-23 LAB — BASIC METABOLIC PANEL
BUN/Creatinine Ratio: 22 (ref 10–24)
BUN: 18 mg/dL (ref 8–27)
CO2: 26 mmol/L (ref 20–29)
Calcium: 9.3 mg/dL (ref 8.6–10.2)
Chloride: 101 mmol/L (ref 96–106)
Creatinine, Ser: 0.82 mg/dL (ref 0.76–1.27)
Glucose: 101 mg/dL — ABNORMAL HIGH (ref 70–99)
Potassium: 4.3 mmol/L (ref 3.5–5.2)
Sodium: 141 mmol/L (ref 134–144)
eGFR: 75 mL/min/{1.73_m2} (ref >59)

## 2021-04-30 ENCOUNTER — Other Ambulatory Visit: Payer: Self-pay

## 2021-04-30 ENCOUNTER — Ambulatory Visit (INDEPENDENT_AMBULATORY_CARE_PROVIDER_SITE_OTHER)
Admission: RE | Admit: 2021-04-30 | Discharge: 2021-04-30 | Disposition: A | Discharge status: Home / Self Care | Payer: Medicare HMO | Source: Ambulatory Visit | Attending: Cardiology | Admitting: Cardiology

## 2021-04-30 DIAGNOSIS — I723 Aneurysm of iliac artery: Secondary | ICD-10-CM | POA: Diagnosis not present

## 2021-04-30 DIAGNOSIS — I7143 Infrarenal abdominal aortic aneurysm, without rupture: Secondary | ICD-10-CM | POA: Diagnosis not present

## 2021-04-30 DIAGNOSIS — I7141 Pararenal abdominal aortic aneurysm, without rupture: Secondary | ICD-10-CM

## 2021-04-30 MED ORDER — IOHEXOL 350 MG/ML SOLN
100.0000 mL | Freq: Once | INTRAVENOUS | Status: AC | PRN
  Administered 2021-04-30: 100 mL via INTRAVENOUS

## 2021-05-15 ENCOUNTER — Other Ambulatory Visit: Payer: Self-pay

## 2021-05-15 DIAGNOSIS — Z79899 Other long term (current) drug therapy: Secondary | ICD-10-CM

## 2021-05-15 DIAGNOSIS — I714 Abdominal aortic aneurysm, without rupture, unspecified: Secondary | ICD-10-CM

## 2021-05-15 DIAGNOSIS — R011 Cardiac murmur, unspecified: Secondary | ICD-10-CM

## 2021-05-15 DIAGNOSIS — I10 Essential (primary) hypertension: Secondary | ICD-10-CM

## 2021-05-15 DIAGNOSIS — E785 Hyperlipidemia, unspecified: Secondary | ICD-10-CM

## 2021-05-15 MED ORDER — HYDROCHLOROTHIAZIDE 25 MG PO TABS: 25.0000 mg | ORAL_TABLET | Freq: Every day | ORAL | 3 refills | Status: DC

## 2021-05-15 NOTE — Telephone Encounter (Signed)
Pt's medication was sent to pt's pharmacy as requested. Confirmation received.  °

## 2021-07-02 ENCOUNTER — Ambulatory Visit (INDEPENDENT_AMBULATORY_CARE_PROVIDER_SITE_OTHER): Payer: Medicare HMO | Admitting: Physician Assistant

## 2021-07-02 ENCOUNTER — Encounter: Payer: Self-pay | Admitting: Physician Assistant

## 2021-07-02 VITALS — BP 126/75 | HR 69 | Temp 97.7°F | Ht 70.0 in | Wt 218.0 lb

## 2021-07-02 DIAGNOSIS — Z87891 Personal history of nicotine dependence: Secondary | ICD-10-CM

## 2021-07-02 DIAGNOSIS — Z0000 Encounter for general adult medical examination without abnormal findings: Secondary | ICD-10-CM | POA: Insufficient documentation

## 2021-07-02 DIAGNOSIS — K2270 Barrett's esophagus without dysplasia: Secondary | ICD-10-CM | POA: Insufficient documentation

## 2021-07-02 DIAGNOSIS — E785 Hyperlipidemia, unspecified: Secondary | ICD-10-CM | POA: Diagnosis not present

## 2021-07-02 DIAGNOSIS — I2510 Atherosclerotic heart disease of native coronary artery without angina pectoris: Secondary | ICD-10-CM | POA: Insufficient documentation

## 2021-07-02 DIAGNOSIS — I714 Abdominal aortic aneurysm, without rupture, unspecified: Secondary | ICD-10-CM | POA: Insufficient documentation

## 2021-07-02 DIAGNOSIS — K219 Gastro-esophageal reflux disease without esophagitis: Secondary | ICD-10-CM | POA: Insufficient documentation

## 2021-07-02 DIAGNOSIS — Z7689 Persons encountering health services in other specified circumstances: Secondary | ICD-10-CM | POA: Diagnosis not present

## 2021-07-02 DIAGNOSIS — I7141 Pararenal abdominal aortic aneurysm, without rupture: Secondary | ICD-10-CM

## 2021-07-02 DIAGNOSIS — I10 Essential (primary) hypertension: Secondary | ICD-10-CM | POA: Diagnosis not present

## 2021-07-02 MED ORDER — PANTOPRAZOLE SODIUM 40 MG PO TBEC: 40.0000 mg | DELAYED_RELEASE_TABLET | Freq: Two times a day (BID) | ORAL | 1 refills | Status: DC

## 2021-07-02 NOTE — Patient Instructions (Signed)
Hypertension, Adult ?Hypertension is another name for high blood pressure. High blood pressure forces your heart to work harder to pump blood. This can cause problems over time. ?There are two numbers in a blood pressure reading. There is a top number (systolic) over a bottom number (diastolic). It is best to have a blood pressure that is below 120/80. ?What are the causes? ?The cause of this condition is not known. Some other conditions can lead to high blood pressure. ?What increases the risk? ?Some lifestyle factors can make you more likely to develop high blood pressure: ?Smoking. ?Not getting enough exercise or physical activity. ?Being overweight. ?Having too much fat, sugar, calories, or salt (sodium) in your diet. ?Drinking too much alcohol. ?Other risk factors include: ?Having any of these conditions: ?Heart disease. ?Diabetes. ?High cholesterol. ?Kidney disease. ?Obstructive sleep apnea. ?Having a family history of high blood pressure and high cholesterol. ?Age. The risk increases with age. ?Stress. ?What are the signs or symptoms? ?High blood pressure may not cause symptoms. Very high blood pressure (hypertensive crisis) may cause: ?Headache. ?Fast or uneven heartbeats (palpitations). ?Shortness of breath. ?Nosebleed. ?Vomiting or feeling like you may vomit (nauseous). ?Changes in how you see. ?Very bad chest pain. ?Feeling dizzy. ?Seizures. ?How is this treated? ?This condition is treated by making healthy lifestyle changes, such as: ?Eating healthy foods. ?Exercising more. ?Drinking less alcohol. ?Your doctor may prescribe medicine if lifestyle changes do not help enough and if: ?Your top number is above 130. ?Your bottom number is above 80. ?Your personal target blood pressure may vary. ?Follow these instructions at home: ?Eating and drinking ? ?If told, follow the DASH eating plan. To follow this plan: ?Fill one half of your plate at each meal with fruits and vegetables. ?Fill one fourth of your plate  at each meal with whole grains. Whole grains include whole-wheat pasta, brown rice, and whole-grain bread. ?Eat or drink low-fat dairy products, such as skim milk or low-fat yogurt. ?Fill one fourth of your plate at each meal with low-fat (lean) proteins. Low-fat proteins include fish, chicken without skin, eggs, beans, and tofu. ?Avoid fatty meat, cured and processed meat, or chicken with skin. ?Avoid pre-made or processed food. ?Limit the amount of salt in your diet to less than 1,500 mg each day. ?Do not drink alcohol if: ?Your doctor tells you not to drink. ?You are pregnant, may be pregnant, or are planning to become pregnant. ?If you drink alcohol: ?Limit how much you have to: ?0-1 drink a day for women. ?0-2 drinks a day for men. ?Know how much alcohol is in your drink. In the [place] one drink equals one 12 oz bottle of beer (355 mL), one 5 oz glass of wine (148 mL), or one 1? oz glass of hard liquor (44 mL). ?Lifestyle ? ?Work with your doctor to stay at a healthy weight or to lose weight. Ask your doctor what the best weight is for you. ?Get at least 30 minutes of exercise that causes your heart to beat faster (aerobic exercise) most days of the week. This may include walking, swimming, or biking. ?Get at least 30 minutes of exercise that strengthens your muscles (resistance exercise) at least 3 days a week. This may include lifting weights or doing Pilates. ?Do not smoke or use any products that contain nicotine or tobacco. If you need help quitting, ask your doctor. ?Check your blood pressure at home as told by your doctor. ?Keep all follow-up visits. ?Medicines ?Take over-the-counter and prescription medicines   only as told by your doctor. Follow directions carefully. ?Do not skip doses of blood pressure medicine. The medicine does not work as well if you skip doses. Skipping doses also puts you at risk for problems. ?Ask your doctor about side effects or reactions to medicines that you should watch  for. ?Contact a doctor if: ?You think you are having a reaction to the medicine you are taking. ?You have headaches that keep coming back. ?You feel dizzy. ?You have swelling in your ankles. ?You have trouble with your vision. ?Get help right away if: ?You get a very bad headache. ?You start to feel mixed up (confused). ?You feel weak or numb. ?You feel faint. ?You have very bad pain in your: ?Chest. ?Belly (abdomen). ?You vomit more than once. ?You have trouble breathing. ?These symptoms may be an emergency. Get help right away. Call 911. ?Do not wait to see if the symptoms will go away. ?Do not drive yourself to the hospital. ?Summary ?Hypertension is another name for high blood pressure. ?High blood pressure forces your heart to work harder to pump blood. ?For most people, a normal blood pressure is less than 120/80. ?Making healthy choices can help lower blood pressure. If your blood pressure does not get lower with healthy choices, you may need to take medicine. ?This information is not intended to replace advice given to you by your health care provider. Make sure you discuss any questions you have with your health care provider. ?Document Revised: 12/04/2020 Document Reviewed: 12/04/2020 ?Elsevier Patient Education ? 2023 Elsevier Inc. ? ?

## 2021-07-02 NOTE — Progress Notes (Signed)
? ?New Patient Office Visit ? ?Subjective   ? ?Patient ID: Marcus Webb, male    DOB: 02-Feb-1946  Age: 76 y.o. MRN: [id number] ? ?CC: No chief complaint on file. ? ? ?HPI ?[name] presents to establish care. Patient has a past medical of hypertension, hyperlipidemia, abdominal aortic aneurysm, barrett's esophagus and GERD. Patient is followed by cardiology and they manage his blood pressure medications and cholesterol medication. Patient reports sees Dr. Dulce Sellar with [facility] for surveillance of Barrett's esophagus and has been on pantoprazole 40 mg BID. Former smoker with 45 pack yr hx, quit 2001. Denies alcohol use. Walks 20 minutes  5 times per week.  ? ? ?Outpatient Encounter Medications as of 07/02/2021  ?Medication Sig  ? aspirin EC 81 MG tablet Take 1 tablet (81 mg total) by mouth daily. Swallow whole.  ? carvedilol (COREG) 12.5 MG tablet Take 1 tablet (12.5 mg total) by mouth 2 (two) times daily.  ? hydrochlorothiazide (HYDRODIURIL) 25 MG tablet Take 1 tablet (25 mg total) by mouth daily. Take in the morning time.  ? lisinopril (ZESTRIL) 10 MG tablet Take 1 tablet (10 mg total) by mouth at bedtime.  ? rosuvastatin (CRESTOR) 20 MG tablet Take 1 tablet (20 mg total) by mouth daily.  ? [DISCONTINUED] pantoprazole (PROTONIX) 40 MG tablet Take 40 mg by mouth 2 (two) times daily.  ? pantoprazole (PROTONIX) 40 MG tablet Take 1 tablet (40 mg total) by mouth 2 (two) times daily.  ? ?No facility-administered encounter medications on file as of 07/02/2021.  ? ? ?History reviewed. No pertinent past medical history. ? ?History reviewed. No pertinent surgical history. ? ?History reviewed. No pertinent family history. ? ?Social History  ? ?Socioeconomic History  ? Marital status: Married  ?  Spouse name: Not on file  ? Number of children: Not on file  ? Years of education: Not on file  ? Highest education level: Not on file  ?Occupational History  ? Not on file  ?Tobacco Use  ? Smoking status: Former  ?  Types:  Cigarettes  ? Smokeless tobacco: Never  ?Substance and Sexual Activity  ? Alcohol use: Not on file  ? Drug use: Not on file  ? Sexual activity: Not on file  ?Other Topics Concern  ? Not on file  ?Social History Narrative  ? Not on file  ? ?Social Determinants of Health  ? ?Financial Resource Strain: Not on file  ?Food Insecurity: Not on file  ?Transportation Needs: Not on file  ?Physical Activity: Not on file  ?Stress: Not on file  ?Social Connections: Not on file  ?Intimate Partner Violence: Not on file  ? ? ?ROS ?Review of Systems:  ?A fourteen system review of systems was performed and found to be positive as per HPI. ? ? ?Objective   ? ?BP 126/75   Pulse 69   Temp 97.7 ?F (36.5 ?C)   Ht 5\' 10"  (1.778 m)   Wt 218 lb (98.9 kg)   SpO2 100%   BMI 31.28 kg/m?  ? ?Physical Exam ?General:  Well Developed, well nourished, appropriate for stated age.  ?Neuro:  Alert and oriented,  extra-ocular muscles intact  ?HEENT:  Normocephalic, atraumatic, neck supple  ?Skin:  no gross rash, warm, pink. ?Cardiac:  RRR, S1 S2 ?Respiratory: CTA B/L  ?Vascular:  Ext warm, no cyanosis apprec.; cap RF less 2 sec. ?Psych:  No HI/SI, judgement and insight good, Euthymic mood. Full Affect. ? ? ?  ? ?Assessment & Plan:  ? ?  Problem List Items Addressed This Visit   ? ?  ? Cardiovascular and Mediastinum  ? Abdominal aortic aneurysm (HCC) - Primary  ? Benign essential hypertension  ?  ? Digestive  ? Barrett's esophagus  ? Relevant Medications  ? pantoprazole (PROTONIX) 40 MG tablet  ? Esophageal reflux  ? Relevant Medications  ? pantoprazole (PROTONIX) 40 MG tablet  ?  ? Other  ? Hyperlipidemia  ? ?Other Visit Diagnoses   ? ? Encounter to establish care      ? ?  ? ?Encounter to establish care: ?-Reviewed cardiology notes.  ?-Will attempt to get records from previous PCP- [facility]. ?-Will request gastroenterology records. ? ?Benign essential hypertension: ?-Followed by cardiology. ?-On Lisinopril 10 mg, HCTZ 25 mg,  Carvedilol 12.5 mg BID. ?-BP today stable. ? ?Abdominal aortic aneurysm: ?-Followed by cardiology. ?-BP controlled. ?-04/30/2021 CT angio abd/pelvis w&w/o contrast:  ?IMPRESSION: ?1. 3.6 cm infrarenal abdominal aortic aneurysm (previously 3.5). ?Recommend follow-up ultrasound every 2 years. This recommendation ?follows ACR consensus guidelines: White Paper of the ACR Incidental ?Findings Committee II on Vascular Findings. J Am Coll Radiol 2013; ?40:981-191. ?2. 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 ?3. 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 ?4. Colonic diverticulosis. ? ?Hyperlipidemia: ?-On rosuvastatin 20 mg. ?-Lipid panel 01/26/2021: HDL 29, LDL 42 ?-Followed by cardiology. ? ?Barrett's Esophagus, GERD: ?-On pantoprazole 40 mg BID. ?-Will request gastroenterology records including upper endoscopy. ? ?Return in about 4 months (around 11/02/2021) for HTN, HLD, GERD.  ? ?Mayer Masker, PA-C ? ? ?

## 2021-08-21 ENCOUNTER — Other Ambulatory Visit: Payer: Self-pay

## 2021-08-21 DIAGNOSIS — E785 Hyperlipidemia, unspecified: Secondary | ICD-10-CM

## 2021-08-21 DIAGNOSIS — Z79899 Other long term (current) drug therapy: Secondary | ICD-10-CM

## 2021-08-21 MED ORDER — ROSUVASTATIN CALCIUM 20 MG PO TABS: 20.0000 mg | ORAL_TABLET | Freq: Every day | ORAL | 1 refills | Status: DC

## 2021-09-24 NOTE — Progress Notes (Signed)
Cardiology Office Note:    Date:  09/25/2021   ID:  Marcus Webb, DOB Sep 30, 1945, MRN [id number]  PCP:  Marcus Reid, PA-C   [facility] Providers Cardiologist:  None {  Referring MD: Marcus Reid, PA-C    History of Present Illness:    Marcus Webb is a 77 y.o. male with a hx of AAA, Barretts esophagus and HTN who presents to clinic for follow-up.  Patient initially seen on 08/2020 for AAA. Blood pressures at that time running high in the AM. We increased his coreg and lisinopril. 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 Has coronary calcifications as well as aortic valve calcification.  Last seen in clinic on 03/2021 where he was doing well with no significant CV symptoms. Blood pressure controlled.   Today, the patient overall feels well. Blood pressures is elevated today in the office but he states it is well controlled at home 120-130s at home. No chest pain, SOB, lightheadedness, dizziness. No LE edema, orthopnea or PND.   Family history: Father with HTN, AAA  History reviewed. No pertinent past medical history.   Current Medications: Current Meds  Medication Sig   aspirin EC 81 MG tablet Take 1 tablet (81 mg total) by mouth daily. Swallow whole.   carvedilol (COREG) 12.5 MG tablet Take 1 tablet (12.5 mg total) by mouth 2 (two) times daily.   hydrochlorothiazide (HYDRODIURIL) 25 MG tablet Take 1 tablet (25 mg total) by mouth daily. Take in the morning time.   lisinopril (ZESTRIL) 10 MG tablet Take 1 tablet (10 mg total) by mouth at bedtime.   pantoprazole (PROTONIX) 40 MG tablet Take 1 tablet (40 mg total) by mouth 2 (two) times daily.   rosuvastatin (CRESTOR) 20 MG tablet Take 1 tablet (20 mg total) by mouth daily.     Allergies:   Patient has no known allergies.   Social History   Socioeconomic History   Marital status: Married    Spouse name: Not on file   Number of children: Not on file   Years of education: Not on file    Highest education level: Not on file  Occupational History   Not on file  Tobacco Use   Smoking status: Former    Types: Cigarettes   Smokeless tobacco: Never  Substance and Sexual Activity   Alcohol use: Not on file   Drug use: Not on file   Sexual activity: Not on file  Other Topics Concern   Not on file  Social History Narrative   Not on file   Social Determinants of Health   Financial Resource Strain: Not on file  Food Insecurity: Not on file  Transportation Needs: Not on file  Physical Activity: Not on file  Stress: Not on file  Social Connections: Not on file     Family History: The patient's family history is not on file.  ROS:   Please see the history of present illness.    Review of Systems  Constitutional:  Negative for chills and fever.  HENT:  Negative for sore throat.   Eyes:  Negative for blurred vision.  Respiratory:  Negative for shortness of breath.   Cardiovascular:  Negative for chest pain, palpitations, orthopnea, claudication, leg swelling and PND.  Gastrointestinal:  Negative for heartburn, nausea and vomiting.  Genitourinary:  Negative for hematuria.  Musculoskeletal:  Negative for falls.  Neurological:  Negative for dizziness and loss of consciousness.  Psychiatric/Behavioral:  Negative for substance abuse.    Marland Kitchen  EKGs/Labs/Other Studies Reviewed:    The following studies were reviewed today: CTA chest 10/01/20: FINDINGS: Cardiovascular: Conventional 3 vessel arch anatomy. The aortic root, ascending, transverse and descending thoracic aorta are all normal in caliber. Scattered atherosclerotic plaque present throughout. Thickened and calcified aortic valve. The heart is normal in size. Extensive atherosclerotic calcifications along the courses of the coronary arteries. No pericardial effusion. Small 1.9 cm water attenuation lesion adjacent to the pericardium at the level of the ascending aorta (image 45 series 4).   Mediastinum/Nodes: No  enlarged mediastinal, hilar, or axillary lymph nodes. Thyroid gland, trachea, and esophagus demonstrate no significant findings.   Lungs/Pleura: Lungs are clear. No pleural effusion or pneumothorax. Mild centrilobular pulmonary emphysema.   Upper Abdomen: No acute abnormality. 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 No significant interval change dating back to in January of 2019.   Musculoskeletal: No chest wall abnormality. No acute or significant osseous findings.   Review of the MIP images confirms the above findings.   IMPRESSION: 1. No evidence of thoracic aortic aneurysm. 2. 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 3. Thickened and calcified aortic valve. 4. Mild centrilobular pulmonary emphysema. 5. 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   Abdominal CT Scan 8./2021 FINDINGS: Lower chest: Heart is normal in size. There mitral annulus calcifications. Probable aortic valvular calcifications. No focal airspace disease or pleural effusion.   Hepatobiliary: Mild hepatomegaly with liver spanning 20.9 cm cranial caudal. Diffusely decreased density consistent with steatosis. Suggestion of nodular hepatic contours. No discrete focal hepatic lesion. No gallstone or pericholecystic inflammation. There is no biliary dilatation.   Pancreas: No ductal dilatation or inflammation.   Spleen: Normal in size without focal abnormality.   Adrenals/Urinary Tract: Bilateral adrenal thickening without dominant adrenal nodule. There is no hydronephrosis. 11 mm lesion from the anterior mid right kidney is too small to accurately characterize, grossly similar in size to prior exam. No renal calculi. Mild wall thickening of the urinary bladder with  right lateral bladder diverticulum. No perivesicular fat stranding.   Stomach/Bowel: Small hiatal hernia. There is no small bowel obstruction or inflammation. Multifocal colonic diverticulosis throughout the entire colon, prominent involving the sigmoid. No evidence of diverticulitis. The appendix is not definitively visualized. Small to moderate volume of stool throughout the colon.   Vascular/Lymphatic: Infrarenal aortic aneurysm maximal dimension 3.5 cm, previously 3.3 cm. Moderate aortic atherosclerosis. There is no periaortic stranding. Left common iliac artery measures 15 mm, right common iliac artery measures 16 mm. Moderate iliac atherosclerosis. No bulky abdominopelvic adenopathy. Left IVC, coursing to the right at the level of the renal veins.   Reproductive: Prominent prostate gland spanning 4.8 cm transverse.   Other: Minimal fat in the inguinal canals. No ascites.   Musculoskeletal: Multilevel degenerative change in the spine. Few scattered bone islands in the pelvis are stable from prior.   IMPRESSION: 1. Infrarenal aortic aneurysm maximal dimension 3.5 cm, previously 3.3 cm. There is no periaortic stranding. Recommend follow-up every 2 years. This recommendation follows ACR consensus guidelines: White Paper of the ACR Incidental Findings Committee II on Vascular Findings. J Am Coll Radiol 2013; 10:789-794. 2. Mild hepatomegaly and hepatic steatosis. Suggestion of nodular hepatic contours, raising concern for cirrhosis. Recommend correlation with cirrhosis risk factors. 3. Colonic diverticulosis without diverticulitis. 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  EKG:  EKG 09/25/21: Sinus brady, HR 57, inf q (old)  Recent Labs: 01/26/2021: ALT 32 04/23/2021: BUN 18; Creatinine, Ser 0.82; Potassium 4.3; Sodium 141  Recent Lipid Panel    Component Value Date/Time   CHOL 96 (L) 01/26/2021 0849   TRIG 146 01/26/2021 0849   HDL 29 (L) 01/26/2021 0849    CHOLHDL 3.3 01/26/2021 0849   LDLCALC 42 01/26/2021 0849          Physical Exam:    VS:  BP (!) 162/86   Pulse (!) 57   Ht _0  (1.778 m)   Wt 226 lb 6.4 oz (102.7 kg)   SpO2 95%   BMI 32.49 kg/m     Wt Readings from Last 3 Encounters:  09/25/21 226 lb 6.4 oz (102.7 kg)  07/02/21 218 lb (98.9 kg)  03/12/21 238 lb 12.8 oz (108.3 kg)     GEN:  Well nourished, well developed in no acute distress HEENT: Normal NECK: No JVD; No carotid bruits CARDIAC: RRR, 2/6 systolic murmur. No rubs, gallops RESPIRATORY:  Clear to auscultation without rales, wheezing or rhonchi  ABDOMEN: Soft, non-tender, non-distended MUSCULOSKELETAL:  No edema; No deformity  SKIN: Warm and dry NEUROLOGIC:  Alert and oriented x 3 PSYCHIATRIC:  Normal affect   ASSESSMENT:    1. Coronary artery disease involving native coronary artery of native heart without angina pectoris   2. Hyperlipidemia, unspecified hyperlipidemia type   3. Hypertension, unspecified type   4. Moderate aortic stenosis   5. Medication management   6. Infrarenal abdominal aortic aneurysm (AAA) without rupture ([place])   7. Aortic atherosclerosis (HCC)     PLAN:    In order of problems listed above:  #AAA: Infrarenal aortic aneurysm maximal dimension 3.6 on CT scan on 04/2021. Recommended for repeat in 2 years -Continue ASA 32m daily -Continue crestor 265mdaily -Repeat CTA abdomen for surveillance in 2 years  #Coronary Artery Disease: #Aortic Atherosclerosis: CT chest with extensive coronary calcification. No current anginal symptoms. -Continue ASA 8166maily -Continue crestor 59m46mily -Continue coreg 12.5mg 88m -Continue lisinopril 10mg 30my  #HLD: LDL 42. Goal <70 -Continue crestor 59mg d81m  #HTN: Elevated in the office but well controlled at home in 120-130s.  -Continue HCTZ 25mg da75m-Continue coreg 12.5mg BID 81mntinue lisinopril 10mg dail36moal <120/80s  #Mild-to-moderate AS: Noted on TTE in  09/2020. -Repeat TTE for monitoring    Medication Adjustments/Labs and Tests Ordered: Current medicines are reviewed at length with the patient today.  Concerns regarding medicines are outlined above.  Orders Placed This Encounter  Procedures   Comp Met (CMET)   CBC w/Diff   Lipid Profile   TSH   HgB A1c   EKG 12-Lead   ECHOCARDIOGRAM COMPLETE    No orders of the defined types were placed in this encounter.    Patient Instructions  Medication Instructions:   Your physician recommends that you continue on your current medications as directed. Please refer to the Current Medication list given to you today.  *If you need a refill on your cardiac medications before your next appointment, please call your pharmacy*   Lab Work:  TODAY--CMET, TSH, CBC W DIFF, HEMOGLOBIN A1C, LIPIDS  If you have labs (blood work) drawn today and your tests are completely normal, you will receive your results only by: MyChart [facility] My[facility]) OR A paper copy in the mail If you have any lab test that  is abnormal or we need to change your treatment, we will call you to review the results.   Testing/Procedures:  Your physician has requested that you have an echocardiogram. Echocardiography is a painless test that uses sound waves to create images of your heart. It provides your doctor with information about the size and shape of your heart and how well your heart's chambers and valves are working. This procedure takes approximately one hour. There are no restrictions for this procedure.  SCHEDULE THIS IN AUGUST 2023 PER DR. Johney Frame    Follow-Up: At [facility], you and your health needs are our priority.  As part of our continuing mission to provide you with exceptional heart care, we have created designated Provider Care Teams.  These Care Teams include your primary Cardiologist (physician) and Advanced Practice Providers (APPs -  Physician Assistants and Nurse Practitioners) who  all work together to provide you with the care you need, when you need it.  We recommend signing up for the patient portal called "MyChart".  Sign up information is provided on this After Visit Summary.  MyChart is used to connect with patients for Virtual Visits (Telemedicine).  Patients are able to view lab/test results, encounter notes, upcoming appointments, etc.  Non-urgent messages can be sent to your provider as well.   To learn more about what you can do with MyChart, go to [email or web address].    Your next appointment:   1 year(s)  The format for your next appointment:   In Person  Provider:   DR. Johney Frame   Important Information About Sugar        I,Mykaella Javier,acting as a scribe for Freada Bergeron, MD.,have documented all relevant documentation on the behalf of Freada Bergeron, MD,as directed by  Freada Bergeron, MD while in the presence of Freada Bergeron, MD.  I, Freada Bergeron, MD, have reviewed all documentation for this visit. The documentation on 09/25/21 for the exam, diagnosis, procedures, and orders are all accurate and complete.   Signed, Freada Bergeron, MD  09/25/2021 9:28 AM    [place]

## 2021-09-25 ENCOUNTER — Ambulatory Visit: Payer: Medicare HMO | Admitting: Cardiology

## 2021-09-25 ENCOUNTER — Encounter: Payer: Self-pay | Admitting: Cardiology

## 2021-09-25 VITALS — BP 162/86 | HR 57 | Ht 70.0 in | Wt 226.4 lb

## 2021-09-25 DIAGNOSIS — I10 Essential (primary) hypertension: Secondary | ICD-10-CM

## 2021-09-25 DIAGNOSIS — E785 Hyperlipidemia, unspecified: Secondary | ICD-10-CM | POA: Diagnosis not present

## 2021-09-25 DIAGNOSIS — I2510 Atherosclerotic heart disease of native coronary artery without angina pectoris: Secondary | ICD-10-CM | POA: Diagnosis not present

## 2021-09-25 DIAGNOSIS — I7143 Infrarenal abdominal aortic aneurysm, without rupture: Secondary | ICD-10-CM | POA: Diagnosis not present

## 2021-09-25 DIAGNOSIS — I700 Atherosclerosis of aorta: Secondary | ICD-10-CM | POA: Diagnosis not present

## 2021-09-25 DIAGNOSIS — I350 Nonrheumatic aortic (valve) stenosis: Secondary | ICD-10-CM

## 2021-09-25 DIAGNOSIS — R7989 Other specified abnormal findings of blood chemistry: Secondary | ICD-10-CM | POA: Diagnosis not present

## 2021-09-25 DIAGNOSIS — Z79899 Other long term (current) drug therapy: Secondary | ICD-10-CM | POA: Diagnosis not present

## 2021-09-25 LAB — COMPREHENSIVE METABOLIC PANEL
ALT: 28 IU/L (ref 0–44)
AST: 24 IU/L (ref 0–40)
Albumin/Globulin Ratio: 2.4 — ABNORMAL HIGH (ref 1.2–2.2)
Albumin: 4.5 g/dL (ref 3.8–4.8)
Alkaline Phosphatase: 55 IU/L (ref 44–121)
BUN/Creatinine Ratio: 20 (ref 10–24)
BUN: 16 mg/dL (ref 8–27)
Bilirubin Total: 0.5 mg/dL (ref 0.0–1.2)
CO2: 26 mmol/L (ref 20–29)
Calcium: 9.1 mg/dL (ref 8.6–10.2)
Chloride: 102 mmol/L (ref 96–106)
Creatinine, Ser: 0.82 mg/dL (ref 0.76–1.27)
Globulin, Total: 1.9 g/dL (ref 1.5–4.5)
Glucose: 96 mg/dL (ref 70–99)
Potassium: 4 mmol/L (ref 3.5–5.2)
Sodium: 141 mmol/L (ref 134–144)
Total Protein: 6.4 g/dL (ref 6.0–8.5)
eGFR: 92 mL/min/{1.73_m2} (ref >59)

## 2021-09-25 LAB — CBC WITH DIFFERENTIAL/PLATELET
Basophils Absolute: 0 10*3/uL (ref 0.0–0.2)
Basos: 1 %
EOS (ABSOLUTE): 0.1 10*3/uL (ref 0.0–0.4)
Eos: 2 %
Hematocrit: 38.4 % (ref 37.5–51.0)
Hemoglobin: 13.1 g/dL (ref 13.0–17.7)
Immature Grans (Abs): 0 10*3/uL (ref 0.0–0.1)
Immature Granulocytes: 1 %
Lymphocytes Absolute: 0.9 10*3/uL (ref 0.7–3.1)
Lymphs: 15 %
MCH: 31.9 pg (ref 26.6–33.0)
MCHC: 34.1 g/dL (ref 31.5–35.7)
MCV: 93 fL (ref 79–97)
Monocytes Absolute: 0.8 10*3/uL (ref 0.1–0.9)
Monocytes: 14 %
Neutrophils Absolute: 4.1 10*3/uL (ref 1.4–7.0)
Neutrophils: 67 %
Platelets: 172 10*3/uL (ref 150–450)
RBC: 4.11 x10E6/uL — ABNORMAL LOW (ref 4.14–5.80)
RDW: 13.9 % (ref 11.6–15.4)
WBC: 6 10*3/uL (ref 3.4–10.8)

## 2021-09-25 LAB — LIPID PANEL
Chol/HDL Ratio: 3.4 ratio (ref 0.0–5.0)
Cholesterol, Total: 89 mg/dL — ABNORMAL LOW (ref 100–199)
HDL: 26 mg/dL — ABNORMAL LOW (ref >39)
LDL Chol Calc (NIH): 39 mg/dL (ref 0–99)
Triglycerides: 136 mg/dL (ref 0–149)
VLDL Cholesterol Cal: 24 mg/dL (ref 5–40)

## 2021-09-25 LAB — HEMOGLOBIN A1C
Est. average glucose Bld gHb Est-mCnc: 114 mg/dL
Hgb A1c MFr Bld: 5.6 % (ref 4.8–5.6)

## 2021-09-25 LAB — TSH: TSH: 0.018 u[IU]/mL — ABNORMAL LOW (ref 0.450–4.500)

## 2021-09-25 NOTE — Patient Instructions (Signed)
Medication Instructions:   Your physician recommends that you continue on your current medications as directed. Please refer to the Current Medication list given to you today.  *If you need a refill on your cardiac medications before your next appointment, please call your pharmacy*   Lab Work:  TODAY--CMET, TSH, CBC W DIFF, HEMOGLOBIN A1C, LIPIDS  If you have labs (blood work) drawn today and your tests are completely normal, you will receive your results only by: MyChart Message (if you have MyChart) OR A paper copy in the mail If you have any lab test that is abnormal or we need to change your treatment, we will call you to review the results.   Testing/Procedures:  Your physician has requested that you have an echocardiogram. Echocardiography is a painless test that uses sound waves to create images of your heart. It provides your doctor with information about the size and shape of your heart and how well your heart's chambers and valves are working. This procedure takes approximately one hour. There are no restrictions for this procedure.  SCHEDULE THIS IN AUGUST 2023 PER DR. Shari Prows    Follow-Up: At [facility], you and your health needs are our priority.  As part of our continuing mission to provide you with exceptional heart care, we have created designated Provider Care Teams.  These Care Teams include your primary Cardiologist (physician) and Advanced Practice Providers (APPs -  Physician Assistants and Nurse Practitioners) who all work together to provide you with the care you need, when you need it.  We recommend signing up for the patient portal called "MyChart".  Sign up information is provided on this After Visit Summary.  MyChart is used to connect with patients for Virtual Visits (Telemedicine).  Patients are able to view lab/test results, encounter notes, upcoming appointments, etc.  Non-urgent messages can be sent to your provider as well.   To learn more about what  you can do with MyChart, go to [email or web address].    Your next appointment:   1 year(s)  The format for your next appointment:   In Person  Provider:   DR. Shari Prows   Important Information About Sugar

## 2021-09-29 LAB — T4, FREE: Free T4: 1.18 ng/dL (ref 0.82–1.77)

## 2021-09-29 LAB — T3, FREE: T3, Free: 3.1 pg/mL (ref 2.0–4.4)

## 2021-09-29 LAB — SPECIMEN STATUS REPORT

## 2021-10-08 ENCOUNTER — Other Ambulatory Visit: Payer: Self-pay

## 2021-10-08 DIAGNOSIS — Z79899 Other long term (current) drug therapy: Secondary | ICD-10-CM

## 2021-10-08 DIAGNOSIS — R011 Cardiac murmur, unspecified: Secondary | ICD-10-CM

## 2021-10-08 DIAGNOSIS — E785 Hyperlipidemia, unspecified: Secondary | ICD-10-CM

## 2021-10-08 DIAGNOSIS — I10 Essential (primary) hypertension: Secondary | ICD-10-CM

## 2021-10-08 DIAGNOSIS — I714 Abdominal aortic aneurysm, without rupture, unspecified: Secondary | ICD-10-CM

## 2021-10-08 MED ORDER — LISINOPRIL 10 MG PO TABS: 10.0000 mg | ORAL_TABLET | Freq: Every day | ORAL | 3 refills | Status: DC

## 2021-10-13 ENCOUNTER — Ambulatory Visit (HOSPITAL_COMMUNITY): Payer: Medicare HMO | Attending: Cardiovascular Disease

## 2021-10-13 DIAGNOSIS — I350 Nonrheumatic aortic (valve) stenosis: Secondary | ICD-10-CM | POA: Insufficient documentation

## 2021-10-13 LAB — ECHOCARDIOGRAM COMPLETE
AR max vel: 1.53 cm2
AV Area VTI: 1.59 cm2
AV Area mean vel: 1.49 cm2
AV Mean grad: 17 mmHg
AV Peak grad: 31.6 mmHg
Ao pk vel: 2.81 m/s
Area-P 1/2: 3.42 cm2
P 1/2 time: 246 msec
S' Lateral: 2.7 cm

## 2021-10-14 ENCOUNTER — Telehealth: Payer: Self-pay | Admitting: *Deleted

## 2021-10-14 DIAGNOSIS — I77819 Aortic ectasia, unspecified site: Secondary | ICD-10-CM

## 2021-10-14 DIAGNOSIS — I350 Nonrheumatic aortic (valve) stenosis: Secondary | ICD-10-CM

## 2021-10-14 NOTE — Telephone Encounter (Signed)
-----   Message from Meriam Sprague, MD sent at 10/13/2021  6:44 PM EDT ----- His echo shows normal pumping function. 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 We just need to ensure his blood pressure is controlled to prevent his aorta from dilating further with goal BP <130/90. We will also continue to monitor his aorta and valve with yearly echoes going forward.

## 2021-10-14 NOTE — Telephone Encounter (Signed)
The patient has been notified of the result and verbalized understanding.  All questions (if any) were answered.  Pt aware that per Dr. Shari Prows, we will order for him to have a repeat echo in one year. Pt aware that I will go ahead and place the order for repeat echo in one year in the system, and send a message to our Echo Scheduler to call him back and arrange this appt for then. Pt verbalized understanding and agrees with this plan.

## 2021-10-26 NOTE — Progress Notes (Unsigned)
Established patient visit   Patient: Marcus Webb   DOB: [birth year]   76 y.o. Male  MRN: [id number] Visit Date: 10/27/2021  No chief complaint on file.  Subjective    HPI  Patient presents for chronic follow-up visit.  HTN: Pt denies chest pain, palpitations, dizziness or lower extremity swelling. Taking medication as directed without side effects. Checks BP at home ***times/wk and readings range in ***. Pt follows a low salt diet.  HLD: Pt taking medication as directed without issues. Denies side effects including myalgias and RUQ pain.   GERD:    Medications: Outpatient Medications Prior to Visit  Medication Sig   aspirin EC 81 MG tablet Take 1 tablet (81 mg total) by mouth daily. Swallow whole.   carvedilol (COREG) 12.5 MG tablet Take 1 tablet (12.5 mg total) by mouth 2 (two) times daily.   hydrochlorothiazide (HYDRODIURIL) 25 MG tablet Take 1 tablet (25 mg total) by mouth daily. Take in the morning time.   lisinopril (ZESTRIL) 10 MG tablet Take 1 tablet (10 mg total) by mouth at bedtime.   pantoprazole (PROTONIX) 40 MG tablet Take 1 tablet (40 mg total) by mouth 2 (two) times daily.   rosuvastatin (CRESTOR) 20 MG tablet Take 1 tablet (20 mg total) by mouth daily.   No facility-administered medications prior to visit.    Review of Systems Review of Systems:  A fourteen system review of systems was performed and found to be positive as per HPI.  Last CBC Lab Results  Component Value Date   WBC 6.0 09/25/2021   HGB 13.1 09/25/2021   HCT 38.4 09/25/2021   MCV 93 09/25/2021   MCH 31.9 09/25/2021   RDW 13.9 09/25/2021   PLT 172 51/88/4166   Last metabolic panel Lab Results  Component Value Date   GLUCOSE 96 09/25/2021   NA 141 09/25/2021   K 4.0 09/25/2021   CL 102 09/25/2021   CO2 26 09/25/2021   BUN 16 09/25/2021   CREATININE 0.82 09/25/2021   EGFR 92 09/25/2021   CALCIUM 9.1 09/25/2021   PROT 6.4 09/25/2021   ALBUMIN 4.5 09/25/2021   LABGLOB 1.9  09/25/2021   AGRATIO 2.4 (H) 09/25/2021   BILITOT 0.5 09/25/2021   ALKPHOS 55 09/25/2021   AST 24 09/25/2021   ALT 28 09/25/2021   Last lipids Lab Results  Component Value Date   CHOL 89 (L) 09/25/2021   HDL 26 (L) 09/25/2021   LDLCALC 39 09/25/2021   TRIG 136 09/25/2021   CHOLHDL 3.4 09/25/2021   Last hemoglobin A1c Lab Results  Component Value Date   HGBA1C 5.6 09/25/2021   Last thyroid functions Lab Results  Component Value Date   TSH 0.018 (L) 09/25/2021   Last vitamin D No results found for: "25OHVITD2", "25OHVITD3", "VD25OH"     Objective    There were no vitals taken for this visit. BP Readings from Last 3 Encounters:  09/25/21 (!) 162/86  07/02/21 126/75  03/12/21 136/78   Wt Readings from Last 3 Encounters:  09/25/21 226 lb 6.4 oz (102.7 kg)  07/02/21 218 lb (98.9 kg)  03/12/21 238 lb 12.8 oz (108.3 kg)    Physical Exam  General:  Well Developed, well nourished, appropriate for stated age.  Neuro:  Alert and oriented,  extra-ocular muscles intact  HEENT:  Normocephalic, atraumatic, neck supple  Skin:  no gross rash, warm, pink. Cardiac:  RRR, S1 S2 Respiratory: CTA B/L  Vascular:  Ext warm, no cyanosis apprec.; cap RF less 2  sec. Psych:  No HI/SI, judgement and insight good, Euthymic mood. Full Affect.   No results found for any visits on 10/27/21.  Assessment & Plan     *** Problem List Items Addressed This Visit   None   No follow-ups on file.        Lorrene Reid, PA-C  [facility] [phone] (phone) [phone] (fax)  [place]

## 2021-10-27 ENCOUNTER — Ambulatory Visit (INDEPENDENT_AMBULATORY_CARE_PROVIDER_SITE_OTHER): Payer: Medicare HMO | Admitting: Physician Assistant

## 2021-10-27 ENCOUNTER — Encounter: Payer: Self-pay | Admitting: Physician Assistant

## 2021-10-27 VITALS — BP 124/77 | HR 60 | Temp 97.7°F | Ht 70.0 in | Wt 227.0 lb

## 2021-10-27 DIAGNOSIS — K219 Gastro-esophageal reflux disease without esophagitis: Secondary | ICD-10-CM

## 2021-10-27 DIAGNOSIS — I10 Essential (primary) hypertension: Secondary | ICD-10-CM | POA: Diagnosis not present

## 2021-10-27 DIAGNOSIS — E0590 Thyrotoxicosis, unspecified without thyrotoxic crisis or storm: Secondary | ICD-10-CM

## 2021-10-27 DIAGNOSIS — E785 Hyperlipidemia, unspecified: Secondary | ICD-10-CM

## 2021-10-27 NOTE — Assessment & Plan Note (Signed)
-  Stable. -Continue current medication regimen.  -Will continue to monitor. 

## 2021-10-27 NOTE — Assessment & Plan Note (Signed)
-  BP elevated on intake, BP repeated with improvement. Continue current medication regimen. Discussed with patient some of his elevated blood pressure readings in the morning are likely secondary to taking his BP before taking his medications. Will continue to monitor.

## 2021-10-27 NOTE — Patient Instructions (Signed)
How to Take Your Blood Pressure Blood pressure measures how strongly your blood is pressing against the walls of your arteries. Arteries are blood vessels that carry blood from your heart throughout your body. You can take your blood pressure at home with a machine. You may need to check your blood pressure at home: To check if you have high blood pressure (hypertension). To check your blood pressure over time. To make sure your blood pressure medicine is working. Supplies needed: Blood pressure machine, or monitor. A chair to sit in. This should be a chair where you can sit upright with your back supported. Do not sit on a soft couch or an armchair. Table or desk. Small notebook. Pencil or pen. How to prepare Avoid these things for 30 minutes before checking your blood pressure: Having drinks with caffeine in them, such as coffee or tea. Drinking alcohol. Eating. Smoking. Exercising. Do these things five minutes before checking your blood pressure: Go to the bathroom and pee (urinate). Sit in a chair. Be quiet. Do not talk. How to take your blood pressure Follow the instructions that came with your machine. If you have a digital blood pressure monitor, these may be the instructions: Sit up straight. Place your feet on the floor. Do not cross your ankles or legs. Rest your left arm at the level of your heart. You may rest it on a table, desk, or chair. Pull up your shirt sleeve. Wrap the blood pressure cuff around the upper part of your left arm. The cuff should be 1 inch (2.5 cm) above your elbow. It is best to wrap the cuff around bare skin. Fit the cuff snugly around your arm, but not too tightly. You should be able to place only one finger between the cuff and your arm. Place the cord so that it rests in the bend of your elbow. Press the power button. Sit quietly while the cuff fills with air and loses air. Write down the numbers on the screen. Wait 2-3 minutes and then repeat  steps 1-10. What do the numbers mean? Two numbers make up your blood pressure. The first number is called systolic pressure. The second is called diastolic pressure. An example of a blood pressure reading is "120 over 80" (or 120/80). If you are an adult and do not have a medical condition, use this guide to find out if your blood pressure is normal: Normal First number: below 120. Second number: below 80. Elevated First number: 120-129. Second number: below 80. Hypertension stage 1 First number: 130-139. Second number: 80-89. Hypertension stage 2 First number: 140 or above. Second number: 90 or above. Your blood pressure is above normal even if only the first or only the second number is above normal. Follow these instructions at home: Medicines Take over-the-counter and prescription medicines only as told by your doctor. Tell your doctor if your medicine is causing side effects. General instructions Check your blood pressure as often as your doctor tells you to. Check your blood pressure at the same time every day. Take your monitor to your next doctor's appointment. Your doctor will: Make sure you are using it correctly. Make sure it is working right. Understand what your blood pressure numbers should be. Keep all follow-up visits. General tips You will need a blood pressure machine or monitor. Your doctor can suggest a monitor. You can buy one at a drugstore or online. When choosing one: Choose one with an arm cuff. Choose one that wraps around your   upper arm. Only one finger should fit between your arm and the cuff. Do not choose one that measures your blood pressure from your wrist or finger. Where to find more information American Heart Association: www.heart.org Contact a doctor if: Your blood pressure keeps being high. Your blood pressure is suddenly low. Get help right away if: Your first blood pressure number is higher than 180. Your second blood pressure number is  higher than 120. These symptoms may be an emergency. Do not wait to see if the symptoms will go away. Get help right away. Call 911. Summary Check your blood pressure at the same time every day. Avoid caffeine, alcohol, smoking, and exercise for 30 minutes before checking your blood pressure. Make sure you understand what your blood pressure numbers should be. This information is not intended to replace advice given to you by your health care provider. Make sure you discuss any questions you have with your health care provider. Document Revised: 10/30/2020 Document Reviewed: 10/30/2020 Elsevier Patient Education  2023 [employer]  

## 2021-10-27 NOTE — Assessment & Plan Note (Signed)
-  Reviewed recent lipid panel: HDL 26, LDL 39. -Continue current medication regimen.  -Will continue to monitor.

## 2021-10-28 LAB — TSH: TSH: 1.95 u[IU]/mL (ref 0.450–4.500)

## 2021-10-28 LAB — T4, FREE: Free T4: 1.22 ng/dL (ref 0.82–1.77)

## 2021-10-28 LAB — T3: T3, Total: 110 ng/dL (ref 71–180)

## 2021-12-29 ENCOUNTER — Other Ambulatory Visit: Payer: Self-pay | Admitting: Physician Assistant

## 2021-12-29 DIAGNOSIS — K2270 Barrett's esophagus without dysplasia: Secondary | ICD-10-CM

## 2022-02-15 ENCOUNTER — Other Ambulatory Visit: Payer: Self-pay

## 2022-02-15 DIAGNOSIS — E785 Hyperlipidemia, unspecified: Secondary | ICD-10-CM

## 2022-02-15 DIAGNOSIS — Z79899 Other long term (current) drug therapy: Secondary | ICD-10-CM

## 2022-02-15 MED ORDER — ROSUVASTATIN CALCIUM 20 MG PO TABS: 20.0000 mg | ORAL_TABLET | Freq: Every day | ORAL | 2 refills | Status: DC

## 2022-03-02 ENCOUNTER — Ambulatory Visit (INDEPENDENT_AMBULATORY_CARE_PROVIDER_SITE_OTHER): Payer: Medicare HMO | Admitting: Nurse Practitioner

## 2022-03-02 ENCOUNTER — Encounter: Payer: Self-pay | Admitting: Nurse Practitioner

## 2022-03-02 VITALS — BP 147/81 | HR 58 | Ht 70.0 in | Wt 233.0 lb

## 2022-03-02 DIAGNOSIS — Z0000 Encounter for general adult medical examination without abnormal findings: Secondary | ICD-10-CM | POA: Diagnosis not present

## 2022-03-02 DIAGNOSIS — I10 Essential (primary) hypertension: Secondary | ICD-10-CM | POA: Diagnosis not present

## 2022-03-02 DIAGNOSIS — E785 Hyperlipidemia, unspecified: Secondary | ICD-10-CM | POA: Diagnosis not present

## 2022-03-02 DIAGNOSIS — I2510 Atherosclerotic heart disease of native coronary artery without angina pectoris: Secondary | ICD-10-CM | POA: Diagnosis not present

## 2022-03-02 NOTE — Progress Notes (Signed)
Subjective:   Marcus Webb is a 77 y.o. male who presents for Medicare Annual/Subsequent preventive examination. --history of hypertension  -slightly elevated today. Pressure log from home indicates better control.  --blood pressures are generally in 222L to 798X systolic and 21J to 94R diastolic.  -CAD  -he does see Dr. Johney Frame, cardiology, on routine basis.  -last lipid check was generally low.  -due for colon cancer screening - he states that he does have a GI provider, Dr. Paulita Fujita. Patient to call and make appointment.  .-denies chest pain, chest pressure, or shortness of breath. He denies headaches or visual disturbances. He denies abdominal pain, nausea, vomiting, or changes in bowel or bladder habits.       Review of Systems    Referred to PCP       Objective:    Today's Vitals   03/02/22 0828 03/02/22 0846 03/02/22 0912  BP: (Abnormal) 169/83 (Abnormal) 149/89 (Abnormal) 147/81  Pulse: (Abnormal) 58    SpO2: 96%    Weight: 233 lb (105.7 kg)    Height: 5\' 10"  (1.778 m)     Body mass index is 33.43 kg/m.    Current Medications (verified) Outpatient Encounter Medications as of 03/02/2022  Medication Sig   aspirin EC 81 MG tablet Take 1 tablet (81 mg total) by mouth daily. Swallow whole.   carvedilol (COREG) 12.5 MG tablet Take 1 tablet (12.5 mg total) by mouth 2 (two) times daily.   hydrochlorothiazide (HYDRODIURIL) 25 MG tablet Take 1 tablet (25 mg total) by mouth daily. Take in the morning time.   lisinopril (ZESTRIL) 10 MG tablet Take 1 tablet (10 mg total) by mouth at bedtime.   pantoprazole (PROTONIX) 40 MG tablet Take 1 tablet by mouth twice daily   rosuvastatin (CRESTOR) 20 MG tablet Take 1 tablet (20 mg total) by mouth daily.   No facility-administered encounter medications on file as of 03/02/2022.    Allergies (verified) Patient has no known allergies.   History: History reviewed. No pertinent past medical history. History reviewed. No pertinent  surgical history. History reviewed. No pertinent family history. Social History   Socioeconomic History   Marital status: Married    Spouse name: Not on file   Number of children: Not on file   Years of education: Not on file   Highest education level: Not on file  Occupational History   Not on file  Tobacco Use   Smoking status: Former    Types: Cigarettes   Smokeless tobacco: Never  Substance and Sexual Activity   Alcohol use: Not on file   Drug use: Not on file   Sexual activity: Not on file  Other Topics Concern   Not on file  Social History Narrative   Not on file   Social Determinants of Health   Financial Resource Strain: Low Risk  (03/02/2022)   Overall Financial Resource Strain (CARDIA)    Difficulty of Paying Living Expenses: Not hard at all  Food Insecurity: No Food Insecurity (03/02/2022)   Hunger Vital Sign    Worried About Running Out of Food in the Last Year: Never true    [place] in the Last Year: Never true  Transportation Needs: No Transportation Needs (03/02/2022)   PRAPARE - Hydrologist (Medical): No    Lack of Transportation (Non-Medical): No  Physical Activity: Insufficiently Active (03/02/2022)   Exercise Vital Sign    Days of Exercise per Week: 4 days  Minutes of Exercise per Session: 30 min  Stress: No Stress Concern Present (03/02/2022)   [employer] of Occupational Health - Occupational Stress Questionnaire    Feeling of Stress : Not at all  Social Connections: Socially Integrated (03/02/2022)   Social Connection and Isolation Panel [NHANES]    Frequency of Communication with Friends and Family: Three times a week    Frequency of Social Gatherings with Friends and Family: More than three times a week    Attends Religious Services: 1 to 4 times per year    Active Member of [employer] or Organizations: Yes    Attends Engineer, structural: More than 4 times per year    Marital Status: Married  Physical  Exam Vitals and nursing note reviewed.  Constitutional:      Appearance: Normal appearance. He is well-developed.  HENT:     Head: Normocephalic and atraumatic.     Right Ear: Tympanic membrane, ear canal and external ear normal.     Left Ear: Tympanic membrane, ear canal and external ear normal.     Nose: Nose normal.     Mouth/Throat:     Mouth: Mucous membranes are moist.     Pharynx: Oropharynx is clear.  Eyes:     Extraocular Movements: Extraocular movements intact.     Conjunctiva/sclera: Conjunctivae normal.     Pupils: Pupils are equal, round, and reactive to light.  Cardiovascular:     Rate and Rhythm: Normal rate and regular rhythm.     Pulses: Normal pulses.     Heart sounds: Murmur heard.  Pulmonary:     Effort: Pulmonary effort is normal.     Breath sounds: Normal breath sounds.  Abdominal:     General: Bowel sounds are normal. There is no distension.     Palpations: Abdomen is soft. There is no mass.     Tenderness: There is no abdominal tenderness. There is no right CVA tenderness, left CVA tenderness, guarding or rebound.     Hernia: No hernia is present.  Musculoskeletal:        General: Normal range of motion.     Cervical back: Normal range of motion and neck supple.  Lymphadenopathy:     Cervical: No cervical adenopathy.  Skin:    General: Skin is warm and dry.     Capillary Refill: Capillary refill takes less than 2 seconds.  Neurological:     General: No focal deficit present.     Mental Status: He is alert and oriented to person, place, and time.  Psychiatric:        Mood and Affect: Mood normal.        Behavior: Behavior normal.        Thought Content: Thought content normal.        Judgment: Judgment normal.     Tobacco Counseling Counseling given: Not Answered   Clinical Intake:  Pre-visit preparation completed: Yes  Pain : No/denies pain     BMI - recorded: 33.43 Nutritional Status: BMI > 30  Obese Nutritional Risks: None  How  often do you need to have someone help you when you read instructions, pamphlets, or other written materials from your doctor or pharmacy?: 1 - Never  Diabetic?no  Interpreter Needed?: No      Activities of Daily Living   Row Labels 10/27/2021    8:07 AM 07/02/2021   10:58 AM  In your present state of health, do you have any difficulty performing the following activities:  Section Header. No data exists in this row.    Hearing?   1 0  Vision?   0 0  Difficulty concentrating or making decisions?   0 0  Walking or climbing stairs?   0 0  Dressing or bathing?   0 0  Doing errands, shopping?   0 0    Patient Care Team: Mayer Masker, PA-C as PCP - General (Physician Assistant)  Indicate any recent Medical Services you may have received from other than [facility] providers in the past year (date may be approximate).     Assessment:   1. Encounter for Medicare annual wellness exam Annual Medicare Wellness visit today   2. Coronary arteriosclerosis Stable. Continue regular visits with cardiology as scheduled   3. Benign essential hypertension Generally stable. Conitnue bp medication as prescribed.   4. Hyperlipidemia, unspecified hyperlipidemia type Stable. Continue rosuvastatin as prescribed    Hearing/Vision screen No results found.  Depression Screen   Row Labels 03/02/2022    8:31 AM 10/27/2021    8:07 AM 07/02/2021   10:57 AM  PHQ 2/9 Scores   Section Header. No data exists in this row.     PHQ - 2 Score   0 0 0  PHQ- 9 Score   0 0 0    Fall Risk   Row Labels 03/02/2022    8:32 AM 10/27/2021    8:07 AM 07/02/2021   10:57 AM  Fall Risk    Section Header. No data exists in this row.     Falls in the past year?   0 0 0  Number falls in past yr:   0 0 0  Injury with Fall?   0 0 0  Risk for fall due to :    No Fall Risks No Fall Risks  Follow up   Falls evaluation completed Falls evaluation completed Falls evaluation completed    FALL RISK PREVENTION PERTAINING TO THE  HOME:  Any stairs in or around the home? Yes  If so, are there any without handrails? Yes  Home free of loose throw rugs in walkways, pet beds, electrical cords, etc? Yes  Adequate lighting in your home to reduce risk of falls? Yes   ASSISTIVE DEVICES UTILIZED TO PREVENT FALLS:  Life alert? No  Use of a cane, walker or w/c? No  Grab bars in the bathroom? No  Shower chair or bench in shower? No  Elevated toilet seat or a handicapped toilet? Yes   TIMED UP AND GO:  Was the test performed? Yes .  Length of time to ambulate 10 feet: 10 sec.   Gait steady and fast without use of assistive device  Cognitive Function:       Row Labels 03/02/2022    8:32 AM  6CIT Screen   Section Header. No data exists in this row.   What Year?   0 points  What month?   0 points  What time?   0 points  Count back from 20   0 points  Months in reverse   0 points  Repeat phrase   0 points  Total Score   0 points    Immunizations Immunization History  Administered Date(s) Administered   Influenza-Unspecified 02/04/2012   Pneumococcal-Unspecified 02/04/2012   Tdap 02/18/2012   Zoster, Live 02/18/2012    TDAP status: Due, Education has been provided regarding the importance of this vaccine. Advised may receive this vaccine at local pharmacy or Health Dept. Aware to provide a  copy of the vaccination record if obtained from local pharmacy or Health Dept. Verbalized acceptance and understanding.  Flu Vaccine status: Declined, Education has been provided regarding the importance of this vaccine but patient still declined. Advised may receive this vaccine at local pharmacy or Health Dept. Aware to provide a copy of the vaccination record if obtained from local pharmacy or Health Dept. Verbalized acceptance and understanding.  Pneumococcal vaccine status: Due, Education has been provided regarding the importance of this vaccine. Advised may receive this vaccine at local pharmacy or Health Dept. Aware to  provide a copy of the vaccination record if obtained from local pharmacy or Health Dept. Verbalized acceptance and understanding.  Covid-19 vaccine status: Completed vaccines  Qualifies for Shingles Vaccine? Yes   Zostavax completed No   Shingrix Completed?: No.    Education has been provided regarding the importance of this vaccine. Patient has been advised to call insurance company to determine out of pocket expense if they have not yet received this vaccine. Advised may also receive vaccine at local pharmacy or Health Dept. Verbalized acceptance and understanding.  Screening Tests Health Maintenance  Topic Date Due   Medicare Annual Wellness (AWV)  Never done   Zoster Vaccines- Shingrix (1 of 2) Never done   COLONOSCOPY (Pts 45-51yrs Insurance coverage will need to be confirmed)  Never done   Pneumonia Vaccine 34+ Years old (1 - PCV) 04/24/2012   DTaP/Tdap/Td (2 - Td or Tdap) 02/17/2022   COVID-19 Vaccine (1) 03/18/2022 (Originally 04/24/1950)   INFLUENZA VACCINE  05/30/2022 (Originally 09/29/2021)   Hepatitis C Screening  03/03/2023 (Originally 04/24/1963)   HPV VACCINES  Aged Out    Health Maintenance  Health Maintenance Due  Topic Date Due   Medicare Annual Wellness (AWV)  Never done   Zoster Vaccines- Shingrix (1 of 2) Never done   COLONOSCOPY (Pts 45-44yrs Insurance coverage will need to be confirmed)  Never done   Pneumonia Vaccine 64+ Years old (1 - PCV) 04/24/2012   DTaP/Tdap/Td (2 - Td or Tdap) 02/17/2022    Colorectal cancer screening: Type of screening: Colonoscopy. Completed to be scheduled by patient. Will see Dr. Gillermina Hu. Repeat every n/a years  Lung Cancer Screening: (Low Dose CT Chest recommended if Age 11-80 years, 30 pack-year currently smoking OR have quit w/in 15years.) does not qualify.   Lung Cancer Screening Referral: n/a  Additional Screening:  Hepatitis C Screening: does not qualify; Completed n/a  Vision Screening: Recommended annual ophthalmology  exams for early detection of glaucoma and other disorders of the eye. Is the patient up to date with their annual eye exam?  No  Who is the provider or what is the name of the office in which the patient attends annual eye exams? N/a If pt is not established with a provider, would they like to be referred to a provider to establish care? No .   Dental Screening: Recommended annual dental exams for proper oral hygiene  Community Resource Referral / Chronic Care Management: CRR required this visit?  No   CCM required this visit?  No      Plan:     I have personally reviewed and noted the following in the patient's chart:   Medical and social history Use of alcohol, tobacco or illicit drugs  Current medications and supplements including opioid prescriptions. Patient is not currently taking opioid prescriptions. Functional ability and status Nutritional status Physical activity Advanced directives List of other physicians Hospitalizations, surgeries, and ER visits in previous 15  months Vitals Screenings to include cognitive, depression, and falls Referrals and appointments  In addition, I have reviewed and discussed with patient certain preventive protocols, quality metrics, and best practice recommendations. A written personalized care plan for preventive services as well as general preventive health recommendations were provided to patient.     Carlean Jews, NP   03/02/2022   Nurse Notes: face to face 25 min

## 2022-03-18 ENCOUNTER — Other Ambulatory Visit: Payer: Self-pay

## 2022-03-18 DIAGNOSIS — I10 Essential (primary) hypertension: Secondary | ICD-10-CM

## 2022-03-18 DIAGNOSIS — Z79899 Other long term (current) drug therapy: Secondary | ICD-10-CM

## 2022-03-18 DIAGNOSIS — R011 Cardiac murmur, unspecified: Secondary | ICD-10-CM

## 2022-03-18 DIAGNOSIS — I714 Abdominal aortic aneurysm, without rupture, unspecified: Secondary | ICD-10-CM

## 2022-03-18 DIAGNOSIS — E785 Hyperlipidemia, unspecified: Secondary | ICD-10-CM

## 2022-03-18 MED ORDER — CARVEDILOL 12.5 MG PO TABS: 12.5000 mg | ORAL_TABLET | Freq: Two times a day (BID) | ORAL | 1 refills | Status: DC

## 2022-03-29 ENCOUNTER — Other Ambulatory Visit: Payer: Self-pay

## 2022-03-29 DIAGNOSIS — K2270 Barrett's esophagus without dysplasia: Secondary | ICD-10-CM

## 2022-03-29 MED ORDER — PANTOPRAZOLE SODIUM 40 MG PO TBEC: 40.0000 mg | DELAYED_RELEASE_TABLET | Freq: Two times a day (BID) | ORAL | 0 refills | Status: DC

## 2022-05-06 ENCOUNTER — Other Ambulatory Visit: Payer: Self-pay | Admitting: *Deleted

## 2022-05-06 DIAGNOSIS — I714 Abdominal aortic aneurysm, without rupture, unspecified: Secondary | ICD-10-CM

## 2022-05-06 DIAGNOSIS — Z79899 Other long term (current) drug therapy: Secondary | ICD-10-CM

## 2022-05-06 DIAGNOSIS — E785 Hyperlipidemia, unspecified: Secondary | ICD-10-CM

## 2022-05-06 DIAGNOSIS — I10 Essential (primary) hypertension: Secondary | ICD-10-CM

## 2022-05-06 DIAGNOSIS — R011 Cardiac murmur, unspecified: Secondary | ICD-10-CM

## 2022-05-06 MED ORDER — HYDROCHLOROTHIAZIDE 25 MG PO TABS: 25.0000 mg | ORAL_TABLET | Freq: Every day | ORAL | 1 refills | Status: DC

## 2022-06-22 ENCOUNTER — Other Ambulatory Visit: Payer: Self-pay | Admitting: Nurse Practitioner

## 2022-06-22 DIAGNOSIS — K2270 Barrett's esophagus without dysplasia: Secondary | ICD-10-CM

## 2022-08-31 ENCOUNTER — Ambulatory Visit (INDEPENDENT_AMBULATORY_CARE_PROVIDER_SITE_OTHER): Payer: Medicare HMO | Admitting: Family Medicine

## 2022-08-31 ENCOUNTER — Encounter: Payer: Self-pay | Admitting: Family Medicine

## 2022-08-31 VITALS — BP 147/87 | HR 55 | Resp 18 | Ht 70.0 in | Wt 231.0 lb

## 2022-08-31 DIAGNOSIS — I700 Atherosclerosis of aorta: Secondary | ICD-10-CM | POA: Diagnosis not present

## 2022-08-31 DIAGNOSIS — I10 Essential (primary) hypertension: Secondary | ICD-10-CM | POA: Diagnosis not present

## 2022-08-31 DIAGNOSIS — I7143 Infrarenal abdominal aortic aneurysm, without rupture: Secondary | ICD-10-CM | POA: Diagnosis not present

## 2022-08-31 DIAGNOSIS — E782 Mixed hyperlipidemia: Secondary | ICD-10-CM

## 2022-08-31 DIAGNOSIS — R7303 Prediabetes: Secondary | ICD-10-CM | POA: Diagnosis not present

## 2022-08-31 DIAGNOSIS — K2270 Barrett's esophagus without dysplasia: Secondary | ICD-10-CM

## 2022-08-31 DIAGNOSIS — E669 Obesity, unspecified: Secondary | ICD-10-CM | POA: Diagnosis not present

## 2022-08-31 DIAGNOSIS — Z6833 Body mass index (BMI) 33.0-33.9, adult: Secondary | ICD-10-CM | POA: Diagnosis not present

## 2022-08-31 MED ORDER — PANTOPRAZOLE SODIUM 40 MG PO TBEC: 40.0000 mg | DELAYED_RELEASE_TABLET | Freq: Two times a day (BID) | ORAL | 1 refills | Status: DC

## 2022-08-31 NOTE — Assessment & Plan Note (Signed)
Continue ASA 81 mg daily, Crestor 20 mg daily, blood pressure medications for AAA and aortic atherosclerosis seen on CT 04/2021.  Next follow-up with cardiology on 10/14/2022.

## 2022-08-31 NOTE — Assessment & Plan Note (Addendum)
Also followed by cardiology.  BP goal <130/80.  Blood pressure elevated in office, blood pressure log from home demonstrates most systolic less than 130 and most diastolic less than 80.  Continue hydrochlorothiazide 25 mg daily, lisinopril 10 mg daily, carvedilol 12.5 mg twice daily.  Will continue to monitor.

## 2022-08-31 NOTE — Progress Notes (Signed)
Established Patient Office Visit  Subjective   Patient ID: Marcus Webb, male    DOB: September 23, 1945  Age: 77 y.o. MRN: [id number]  Chief Complaint  Patient presents with   Hypertension    HPI Marcus Webb is a 77 y.o. male presenting today for follow up of hypertension. Hypertension: Patient here for follow-up of elevated blood pressure. He  is adherent to low salt diet.   Pt denies chest pain, SOB, dizziness, edema, syncope, fatigue or heart palpitations. Taking lisinopril at bedtime, hydrochlorothiazide in the morning, carvedilol twice daily, reports excellent compliance with treatment. Denies side effects.  ROS Negative unless otherwise noted in HPI   Objective:     BP (!) 147/87   Pulse (!) 55   Resp 18   Ht 5\' 10"  (1.778 m)   Wt 231 lb (104.8 kg)   SpO2 96%   BMI 33.15 kg/m   Physical Exam Constitutional:      General: He is not in acute distress.    Appearance: Normal appearance.  HENT:     Head: Normocephalic and atraumatic.  Cardiovascular:     Rate and Rhythm: Normal rate and regular rhythm.     Heart sounds: Murmur heard.     No friction rub. No gallop.  Pulmonary:     Effort: Pulmonary effort is normal. No respiratory distress.     Breath sounds: Normal breath sounds. No wheezing, rhonchi or rales.  Skin:    General: Skin is warm and dry.  Neurological:     Mental Status: He is alert and oriented to person, place, and time.  Psychiatric:        Mood and Affect: Mood normal.     Assessment & Plan:  Benign essential hypertension Assessment & Plan: Also followed by cardiology.  BP goal <130/80.  Blood pressure elevated in office, blood pressure log from home demonstrates most systolic less than 130 and most diastolic less than 80.  Continue hydrochlorothiazide 25 mg daily, lisinopril 10 mg daily, carvedilol 12.5 mg twice daily.  Will continue to monitor.  Orders: -     CBC with Differential/Platelet; Future -     Comprehensive metabolic panel;  Future  Infrarenal abdominal aortic aneurysm (AAA) without rupture [facility]) Assessment & Plan: Followed by cardiology, last office visit 08/2021 reviewed note.  AAA initially seen 09/17/2020.  Still present on CT scan 04/2021, repeat CT abdomen for surveillance in 2 years.  Continue ASA 81 mg daily, Crestor 20 mg daily, blood pressure medications for AAA and aortic atherosclerosis seen on CT 04/2021.  Next follow-up with cardiology on 10/14/2022.   Atherosclerosis of aorta [facility]) Assessment & Plan: Continue ASA 81 mg daily, Crestor 20 mg daily, blood pressure medications for AAA and aortic atherosclerosis seen on CT 04/2021.  Next follow-up with cardiology on 10/14/2022.   Mixed hyperlipidemia -     Lipid panel; Future  Class 1 obesity with serious comorbidity and body mass index (BMI) of 33.0 to 33.9 in adult, unspecified obesity type -     CBC with Differential/Platelet; Future -     Comprehensive metabolic panel; Future -     Hemoglobin A1c; Future -     Lipid panel; Future  Barrett's esophagus without dysplasia -     Pantoprazole Sodium; Take 1 tablet (40 mg total) by mouth 2 (two) times daily.  Dispense: 180 tablet; Refill: 1    Return in about 6 months (around 03/03/2023) for follow-up for HTN, HLD, fasting blood work 1 week before (CBC,  CMP, lipids).   Melida Quitter, PA

## 2022-08-31 NOTE — Patient Instructions (Addendum)
PREVENTATIVE CARE: -Due for Shingles, pneumonia, tetanus vaccines -Colonoscopy will be up to date -Otherwise, everything else is up to date!

## 2022-08-31 NOTE — Assessment & Plan Note (Signed)
Followed by cardiology, last office visit 08/2021 reviewed note.  AAA initially seen 09/17/2020.  Still present on CT scan 04/2021, repeat CT abdomen for surveillance in 2 years.  Continue ASA 81 mg daily, Crestor 20 mg daily, blood pressure medications for AAA and aortic atherosclerosis seen on CT 04/2021.  Next follow-up with cardiology on 10/14/2022.

## 2022-09-01 LAB — COMPREHENSIVE METABOLIC PANEL
ALT: 24 IU/L (ref 0–44)
AST: 18 IU/L (ref 0–40)
Albumin: 4.4 g/dL (ref 3.8–4.8)
Alkaline Phosphatase: 59 IU/L (ref 44–121)
BUN/Creatinine Ratio: 19 (ref 10–24)
BUN: 16 mg/dL (ref 8–27)
Bilirubin Total: 0.4 mg/dL (ref 0.0–1.2)
CO2: 24 mmol/L (ref 20–29)
Calcium: 8.7 mg/dL (ref 8.6–10.2)
Chloride: 102 mmol/L (ref 96–106)
Creatinine, Ser: 0.83 mg/dL (ref 0.76–1.27)
Globulin, Total: 2 g/dL (ref 1.5–4.5)
Glucose: 93 mg/dL (ref 70–99)
Potassium: 4.1 mmol/L (ref 3.5–5.2)
Sodium: 140 mmol/L (ref 134–144)
Total Protein: 6.4 g/dL (ref 6.0–8.5)
eGFR: 91 mL/min/{1.73_m2} (ref >59)

## 2022-09-01 LAB — CBC WITH DIFFERENTIAL/PLATELET
Basophils Absolute: 0 10*3/uL (ref 0.0–0.2)
Basos: 1 %
EOS (ABSOLUTE): 0.1 10*3/uL (ref 0.0–0.4)
Eos: 2 %
Hematocrit: 37.8 % (ref 37.5–51.0)
Hemoglobin: 12.7 g/dL — ABNORMAL LOW (ref 13.0–17.7)
Immature Grans (Abs): 0 10*3/uL (ref 0.0–0.1)
Immature Granulocytes: 0 %
Lymphocytes Absolute: 0.8 10*3/uL (ref 0.7–3.1)
Lymphs: 17 %
MCH: 31.6 pg (ref 26.6–33.0)
MCHC: 33.6 g/dL (ref 31.5–35.7)
MCV: 94 fL (ref 79–97)
Monocytes Absolute: 0.7 10*3/uL (ref 0.1–0.9)
Monocytes: 14 %
Neutrophils Absolute: 3.1 10*3/uL (ref 1.4–7.0)
Neutrophils: 66 %
Platelets: 155 10*3/uL (ref 150–450)
RBC: 4.02 x10E6/uL — ABNORMAL LOW (ref 4.14–5.80)
RDW: 13.4 % (ref 11.6–15.4)
WBC: 4.7 10*3/uL (ref 3.4–10.8)

## 2022-09-01 LAB — LIPID PANEL
Chol/HDL Ratio: 3 ratio (ref 0.0–5.0)
Cholesterol, Total: 84 mg/dL — ABNORMAL LOW (ref 100–199)
HDL: 28 mg/dL — ABNORMAL LOW (ref >39)
LDL Chol Calc (NIH): 31 mg/dL (ref 0–99)
Triglycerides: 144 mg/dL (ref 0–149)
VLDL Cholesterol Cal: 25 mg/dL (ref 5–40)

## 2022-09-01 LAB — HEMOGLOBIN A1C
Est. average glucose Bld gHb Est-mCnc: 120 mg/dL
Hgb A1c MFr Bld: 5.8 % — ABNORMAL HIGH (ref 4.8–5.6)

## 2022-09-08 ENCOUNTER — Other Ambulatory Visit: Payer: Self-pay

## 2022-09-08 DIAGNOSIS — I10 Essential (primary) hypertension: Secondary | ICD-10-CM

## 2022-09-08 DIAGNOSIS — Z79899 Other long term (current) drug therapy: Secondary | ICD-10-CM

## 2022-09-08 DIAGNOSIS — R011 Cardiac murmur, unspecified: Secondary | ICD-10-CM

## 2022-09-08 DIAGNOSIS — E785 Hyperlipidemia, unspecified: Secondary | ICD-10-CM

## 2022-09-08 DIAGNOSIS — I714 Abdominal aortic aneurysm, without rupture, unspecified: Secondary | ICD-10-CM

## 2022-09-08 MED ORDER — CARVEDILOL 12.5 MG PO TABS
12.5000 mg | ORAL_TABLET | Freq: Two times a day (BID) | ORAL | 0 refills | Status: DC
Start: 2022-09-08 — End: 2022-12-07

## 2022-09-30 ENCOUNTER — Other Ambulatory Visit: Payer: Self-pay

## 2022-09-30 DIAGNOSIS — Z79899 Other long term (current) drug therapy: Secondary | ICD-10-CM

## 2022-09-30 DIAGNOSIS — R011 Cardiac murmur, unspecified: Secondary | ICD-10-CM

## 2022-09-30 DIAGNOSIS — E785 Hyperlipidemia, unspecified: Secondary | ICD-10-CM

## 2022-09-30 DIAGNOSIS — I10 Essential (primary) hypertension: Secondary | ICD-10-CM

## 2022-09-30 DIAGNOSIS — I714 Abdominal aortic aneurysm, without rupture, unspecified: Secondary | ICD-10-CM

## 2022-09-30 MED ORDER — LISINOPRIL 10 MG PO TABS
10.0000 mg | ORAL_TABLET | Freq: Every day | ORAL | 0 refills | Status: DC
Start: 2022-09-30 — End: 2022-10-29

## 2022-10-14 ENCOUNTER — Ambulatory Visit (HOSPITAL_COMMUNITY): Payer: Medicare HMO | Attending: Cardiology

## 2022-10-14 DIAGNOSIS — I5030 Unspecified diastolic (congestive) heart failure: Secondary | ICD-10-CM | POA: Diagnosis not present

## 2022-10-14 DIAGNOSIS — I350 Nonrheumatic aortic (valve) stenosis: Secondary | ICD-10-CM | POA: Diagnosis not present

## 2022-10-14 DIAGNOSIS — I340 Nonrheumatic mitral (valve) insufficiency: Secondary | ICD-10-CM | POA: Diagnosis not present

## 2022-10-14 DIAGNOSIS — I77819 Aortic ectasia, unspecified site: Secondary | ICD-10-CM | POA: Diagnosis not present

## 2022-10-14 LAB — ECHOCARDIOGRAM COMPLETE
AR max vel: 1.77 cm2
AV Area VTI: 1.75 cm2
AV Area mean vel: 1.62 cm2
AV Mean grad: 18.8 mmHg
AV Peak grad: 36.7 mmHg
Ao pk vel: 3.03 m/s
Area-P 1/2: 3.08 cm2
S' Lateral: 2.3 cm

## 2022-10-29 ENCOUNTER — Other Ambulatory Visit: Payer: Self-pay

## 2022-10-29 DIAGNOSIS — I714 Abdominal aortic aneurysm, without rupture, unspecified: Secondary | ICD-10-CM

## 2022-10-29 DIAGNOSIS — E785 Hyperlipidemia, unspecified: Secondary | ICD-10-CM

## 2022-10-29 DIAGNOSIS — I10 Essential (primary) hypertension: Secondary | ICD-10-CM

## 2022-10-29 DIAGNOSIS — R011 Cardiac murmur, unspecified: Secondary | ICD-10-CM

## 2022-10-29 DIAGNOSIS — Z79899 Other long term (current) drug therapy: Secondary | ICD-10-CM

## 2022-10-29 MED ORDER — LISINOPRIL 10 MG PO TABS
10.0000 mg | ORAL_TABLET | Freq: Every day | ORAL | 0 refills | Status: DC
Start: 2022-10-29 — End: 2022-12-07

## 2022-11-03 ENCOUNTER — Other Ambulatory Visit: Payer: Self-pay

## 2022-11-03 DIAGNOSIS — E785 Hyperlipidemia, unspecified: Secondary | ICD-10-CM

## 2022-11-03 DIAGNOSIS — Z79899 Other long term (current) drug therapy: Secondary | ICD-10-CM

## 2022-11-03 MED ORDER — ROSUVASTATIN CALCIUM 20 MG PO TABS
20.0000 mg | ORAL_TABLET | Freq: Every day | ORAL | 0 refills | Status: DC
Start: 2022-11-03 — End: 2022-12-07

## 2022-11-08 ENCOUNTER — Other Ambulatory Visit: Payer: Self-pay

## 2022-11-08 DIAGNOSIS — Z79899 Other long term (current) drug therapy: Secondary | ICD-10-CM

## 2022-11-08 DIAGNOSIS — I714 Abdominal aortic aneurysm, without rupture, unspecified: Secondary | ICD-10-CM

## 2022-11-08 DIAGNOSIS — I10 Essential (primary) hypertension: Secondary | ICD-10-CM

## 2022-11-08 DIAGNOSIS — E785 Hyperlipidemia, unspecified: Secondary | ICD-10-CM

## 2022-11-08 DIAGNOSIS — R011 Cardiac murmur, unspecified: Secondary | ICD-10-CM

## 2022-11-08 MED ORDER — HYDROCHLOROTHIAZIDE 25 MG PO TABS
25.0000 mg | ORAL_TABLET | Freq: Every day | ORAL | 0 refills | Status: DC
Start: 2022-11-08 — End: 2022-12-07

## 2022-11-30 ENCOUNTER — Encounter: Payer: Self-pay | Admitting: Cardiology

## 2022-11-30 ENCOUNTER — Ambulatory Visit: Payer: Medicare HMO | Attending: Cardiology | Admitting: Cardiology

## 2022-11-30 VITALS — BP 132/84 | HR 59 | Ht 69.5 in | Wt 231.0 lb

## 2022-11-30 DIAGNOSIS — I77819 Aortic ectasia, unspecified site: Secondary | ICD-10-CM | POA: Diagnosis not present

## 2022-11-30 DIAGNOSIS — I10 Essential (primary) hypertension: Secondary | ICD-10-CM

## 2022-11-30 DIAGNOSIS — I2510 Atherosclerotic heart disease of native coronary artery without angina pectoris: Secondary | ICD-10-CM | POA: Diagnosis not present

## 2022-11-30 DIAGNOSIS — E785 Hyperlipidemia, unspecified: Secondary | ICD-10-CM

## 2022-11-30 DIAGNOSIS — I350 Nonrheumatic aortic (valve) stenosis: Secondary | ICD-10-CM

## 2022-11-30 NOTE — Patient Instructions (Signed)
Medication Instructions:  Your physician recommends that you continue on your current medications as directed. Please refer to the Current Medication list given to you today.  *If you need a refill on your cardiac medications before your next appointment, please call your pharmacy*  Lab Work: None ordered today.  Testing/Procedures: Your physician has requested that you have an echocardiogram in 1 year (prior to next appointment with Dr. Anne Fu). Echocardiography is a painless test that uses sound waves to create images of your heart. It provides your doctor with information about the size and shape of your heart and how well your heart's chambers and valves are working. This procedure takes approximately one hour. There are no restrictions for this procedure. Please do NOT wear cologne, perfume, aftershave, or lotions (deodorant is allowed). Please arrive 15 minutes prior to your appointment time.  Your physician has requested that you have an abdominal aorta duplex in 1 year (prior to next appointment with Dr. Anne Fu). During this test, an ultrasound is used to evaluate the aorta. Allow 30 minutes for this exam. Do not eat after midnight the day before and avoid carbonated beverages   Follow-Up: At [facility], you and your health needs are our priority.  As part of our continuing mission to provide you with exceptional heart care, we have created designated Provider Care Teams.  These Care Teams include your primary Cardiologist (physician) and Advanced Practice Providers (APPs -  Physician Assistants and Nurse Practitioners) who all work together to provide you with the care you need, when you need it.  Your next appointment:   12 month(s)  The format for your next appointment:   In Person  Provider:   Donato Schultz, MD

## 2022-11-30 NOTE — Progress Notes (Signed)
Cardiology Office Note:  .   Date:  11/30/2022  ID:  Marcus Webb, DOB 07-17-1945, MRN [id number] PCP: Melida Quitter, PA  [facility] Providers Cardiologist:  Donato Schultz, MD     History of Present Illness: .   Marcus Webb is a 77 y.o. male Discussed with the use of AI scribe software  History of Present Illness   The patient, a 77 year old with a history of abdominal aortic aneurysm (AAA), Barrett's esophagus, and hypertension, was initially seen in July 2022 for AAA. At that time, blood pressures were notably high. A CTA in August 2022 revealed a pericardial cyst (benign), coronary calcifications, and aortic valve calcifications, consistent with his known aortic stenosis. No evidence of thoracic aortic aneurysm was found. His father also had hypertension and AAA.  The patient's medication regimen includes Lisinopril 10mg  once daily, Crestor 20mg  once daily for lipids, Hydrochlorothiazide 25mg  once daily, Coreg 12.5mg  once daily, and Aspirin 81mg  daily. His LDL is 31, HDL is 28, Hemoglobin A1c is 5.8, Hemoglobin is 12.7, and Creatinine is 0.8.  His infrarenal aorta was measured at 3.5 cm from a CT scan in 2021 and 3.6 cm from a CT scan in 2023. He denies any abdominal discomfort.  His aortic valve is considered moderate and stable. He reports no shortness of breath or chest pain, but does note becoming short of breath with significant exertion. He is active, particularly with church activities, and walks for 30 minutes daily. He denies smoking and drinking.  He also has Barrett's esophagus, which has remained stable. His abdominal aortic aneurysm is also stable at 3.6 cm. 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       Social History - Denies smoking - Denies drinking   ROS: No CP, no SOB  Studies Reviewed: Marland Kitchen   EKG Interpretation Date/Time:  Tuesday November 30 2022 13:23:37 EDT Ventricular Rate:  59 PR  Interval:  232 QRS Duration:  102 QT Interval:  438 QTC Calculation: 433 R Axis:   12  Text Interpretation: Sinus bradycardia with 1st degree A-V block No previous ECGs available Confirmed by Donato Schultz ([id number]) on 11/30/2022 1:28:24 PM    Results LABS LDL: 31 HDL: 28 Hemoglobin A1c: 5.8 Hemoglobin: 12.7 Creatinine: 0.8  RADIOLOGY CTA: Pericardial cyst, no evidence of thoracic aortic aneurysm, coronary calcifications, aortic valve calcifications (09/2020) CT scan: Infrarenal aorta 3.5 cm (2021) CT scan: Infrarenal aorta 3.6 cm (2023)  DIAGNOSTIC Echocardiogram: Normal pump function, 60% EF, mildly dilated atria, moderate aortic stenosis with mean gradient of 17 mmHg (10/14/2022)  Risk Assessment/Calculations:            Physical Exam:   VS:  BP 132/84   Pulse (!) 59   Ht 5' 9.5" (1.765 m)   Wt 231 lb (104.8 kg)   SpO2 93%   BMI 33.62 kg/m    Wt Readings from Last 3 Encounters:  11/30/22 231 lb (104.8 kg)  08/31/22 231 lb (104.8 kg)  03/02/22 233 lb (105.7 kg)    GEN: Well nourished, well developed in no acute distress NECK: No JVD; No carotid bruits CARDIAC: RRR, no murmurs, no rubs, no gallops RESPIRATORY:  Clear to auscultation without rales, wheezing or rhonchi  ABDOMEN: Soft, non-tender, non-distended EXTREMITIES:  No edema; No deformity   ASSESSMENT AND PLAN: .       Aortic Stenosis Moderate severity, stable from previous assessment. No symptoms of shortness of breath or chest pain. -  Continue current management. -Recheck echocardiogram in 1 year.  Abdominal Aortic Aneurysm Stable at 3.6 cm from CT scan in 2023. No abdominal discomfort reported. -Plan to repeat Abd aorta u/s in 2025.  Hypertension Well controlled with current medication regimen (Lisinopril 10mg  at bedtime, Coreg 12.5mg  twice a day, Hydrochlorothiazide 25mg  once a day). -Continue current medications.  Hyperlipidemia Well controlled with Crestor 20mg  once a day. LDL 31, HDL  28. -Continue Crestor 20mg  once a day.  Medication Management Patient requested 90-day prescriptions for convenience during travel. -Change all prescriptions to 90-day supply.  Follow-up -Return in 1 year for follow-up and repeat echocardiogram. -Consider ultrasound of abdomen to monitor aortic aneurysm.             Signed, Donato Schultz, MD

## 2022-12-07 ENCOUNTER — Other Ambulatory Visit: Payer: Self-pay | Admitting: Cardiology

## 2022-12-07 DIAGNOSIS — I10 Essential (primary) hypertension: Secondary | ICD-10-CM

## 2022-12-07 DIAGNOSIS — I714 Abdominal aortic aneurysm, without rupture, unspecified: Secondary | ICD-10-CM

## 2022-12-07 DIAGNOSIS — R011 Cardiac murmur, unspecified: Secondary | ICD-10-CM

## 2022-12-07 DIAGNOSIS — Z79899 Other long term (current) drug therapy: Secondary | ICD-10-CM

## 2022-12-07 DIAGNOSIS — E785 Hyperlipidemia, unspecified: Secondary | ICD-10-CM

## 2022-12-07 MED ORDER — CARVEDILOL 12.5 MG PO TABS: 12.5000 mg | ORAL_TABLET | Freq: Two times a day (BID) | ORAL | 3 refills | Status: DC

## 2023-01-14 DIAGNOSIS — K2270 Barrett's esophagus without dysplasia: Secondary | ICD-10-CM | POA: Diagnosis not present

## 2023-01-14 DIAGNOSIS — K219 Gastro-esophageal reflux disease without esophagitis: Secondary | ICD-10-CM | POA: Diagnosis not present

## 2023-01-14 DIAGNOSIS — Z860101 Personal history of adenomatous and serrated colon polyps: Secondary | ICD-10-CM | POA: Diagnosis not present

## 2023-02-10 ENCOUNTER — Other Ambulatory Visit: Payer: Self-pay

## 2023-02-10 DIAGNOSIS — K317 Polyp of stomach and duodenum: Secondary | ICD-10-CM | POA: Diagnosis not present

## 2023-02-10 DIAGNOSIS — K449 Diaphragmatic hernia without obstruction or gangrene: Secondary | ICD-10-CM | POA: Diagnosis not present

## 2023-02-10 DIAGNOSIS — K2270 Barrett's esophagus without dysplasia: Secondary | ICD-10-CM | POA: Diagnosis not present

## 2023-02-10 DIAGNOSIS — E782 Mixed hyperlipidemia: Secondary | ICD-10-CM

## 2023-02-10 DIAGNOSIS — I10 Essential (primary) hypertension: Secondary | ICD-10-CM

## 2023-02-10 DIAGNOSIS — Z860101 Personal history of adenomatous and serrated colon polyps: Secondary | ICD-10-CM | POA: Diagnosis not present

## 2023-02-10 DIAGNOSIS — D123 Benign neoplasm of transverse colon: Secondary | ICD-10-CM | POA: Diagnosis not present

## 2023-02-10 DIAGNOSIS — K5730 Diverticulosis of large intestine without perforation or abscess without bleeding: Secondary | ICD-10-CM | POA: Diagnosis not present

## 2023-02-10 DIAGNOSIS — E66811 Obesity, class 1: Secondary | ICD-10-CM

## 2023-02-10 DIAGNOSIS — Z09 Encounter for follow-up examination after completed treatment for conditions other than malignant neoplasm: Secondary | ICD-10-CM | POA: Diagnosis not present

## 2023-02-10 LAB — HM COLONOSCOPY

## 2023-02-14 DIAGNOSIS — D123 Benign neoplasm of transverse colon: Secondary | ICD-10-CM | POA: Diagnosis not present

## 2023-02-14 DIAGNOSIS — K2270 Barrett's esophagus without dysplasia: Secondary | ICD-10-CM | POA: Diagnosis not present

## 2023-02-14 DIAGNOSIS — K317 Polyp of stomach and duodenum: Secondary | ICD-10-CM | POA: Diagnosis not present

## 2023-02-25 ENCOUNTER — Other Ambulatory Visit: Payer: Medicare HMO

## 2023-02-25 DIAGNOSIS — E782 Mixed hyperlipidemia: Secondary | ICD-10-CM | POA: Diagnosis not present

## 2023-02-25 DIAGNOSIS — I10 Essential (primary) hypertension: Secondary | ICD-10-CM

## 2023-02-25 DIAGNOSIS — E66811 Obesity, class 1: Secondary | ICD-10-CM | POA: Diagnosis not present

## 2023-02-25 DIAGNOSIS — Z6833 Body mass index (BMI) 33.0-33.9, adult: Secondary | ICD-10-CM | POA: Diagnosis not present

## 2023-02-26 LAB — COMPREHENSIVE METABOLIC PANEL
ALT: 33 [IU]/L (ref 0–44)
AST: 24 [IU]/L (ref 0–40)
Albumin: 4.2 g/dL (ref 3.8–4.8)
Alkaline Phosphatase: 59 [IU]/L (ref 44–121)
BUN/Creatinine Ratio: 25 — ABNORMAL HIGH (ref 10–24)
BUN: 17 mg/dL (ref 8–27)
Bilirubin Total: 0.4 mg/dL (ref 0.0–1.2)
CO2: 23 mmol/L (ref 20–29)
Calcium: 8.9 mg/dL (ref 8.6–10.2)
Chloride: 102 mmol/L (ref 96–106)
Creatinine, Ser: 0.69 mg/dL — ABNORMAL LOW (ref 0.76–1.27)
Globulin, Total: 2.2 g/dL (ref 1.5–4.5)
Glucose: 98 mg/dL (ref 70–99)
Potassium: 3.7 mmol/L (ref 3.5–5.2)
Sodium: 143 mmol/L (ref 134–144)
Total Protein: 6.4 g/dL (ref 6.0–8.5)
eGFR: 97 mL/min/{1.73_m2} (ref >59)

## 2023-02-26 LAB — LIPID PANEL
Chol/HDL Ratio: 3.6 {ratio} (ref 0.0–5.0)
Cholesterol, Total: 90 mg/dL — ABNORMAL LOW (ref 100–199)
HDL: 25 mg/dL — ABNORMAL LOW (ref >39)
LDL Chol Calc (NIH): 32 mg/dL (ref 0–99)
Triglycerides: 208 mg/dL — ABNORMAL HIGH (ref 0–149)
VLDL Cholesterol Cal: 33 mg/dL (ref 5–40)

## 2023-02-26 LAB — CBC WITH DIFFERENTIAL/PLATELET
Basophils Absolute: 0 10*3/uL (ref 0.0–0.2)
Basos: 1 %
EOS (ABSOLUTE): 0.1 10*3/uL (ref 0.0–0.4)
Eos: 2 %
Hematocrit: 38.5 % (ref 37.5–51.0)
Hemoglobin: 12.9 g/dL — ABNORMAL LOW (ref 13.0–17.7)
Immature Grans (Abs): 0 10*3/uL (ref 0.0–0.1)
Immature Granulocytes: 0 %
Lymphocytes Absolute: 0.9 10*3/uL (ref 0.7–3.1)
Lymphs: 16 %
MCH: 31.6 pg (ref 26.6–33.0)
MCHC: 33.5 g/dL (ref 31.5–35.7)
MCV: 94 fL (ref 79–97)
Monocytes Absolute: 0.8 10*3/uL (ref 0.1–0.9)
Monocytes: 14 %
Neutrophils Absolute: 3.5 10*3/uL (ref 1.4–7.0)
Neutrophils: 67 %
Platelets: 181 10*3/uL (ref 150–450)
RBC: 4.08 x10E6/uL — ABNORMAL LOW (ref 4.14–5.80)
RDW: 13.5 % (ref 11.6–15.4)
WBC: 5.3 10*3/uL (ref 3.4–10.8)

## 2023-03-03 ENCOUNTER — Ambulatory Visit (INDEPENDENT_AMBULATORY_CARE_PROVIDER_SITE_OTHER): Payer: PPO | Admitting: Family Medicine

## 2023-03-03 ENCOUNTER — Ambulatory Visit: Payer: PPO | Admitting: Family Medicine

## 2023-03-03 ENCOUNTER — Encounter: Payer: Self-pay | Admitting: Family Medicine

## 2023-03-03 VITALS — BP 122/78 | HR 60 | Temp 97.8°F | Ht 70.0 in | Wt 240.0 lb

## 2023-03-03 DIAGNOSIS — E782 Mixed hyperlipidemia: Secondary | ICD-10-CM

## 2023-03-03 DIAGNOSIS — I7143 Infrarenal abdominal aortic aneurysm, without rupture: Secondary | ICD-10-CM | POA: Diagnosis not present

## 2023-03-03 DIAGNOSIS — I700 Atherosclerosis of aorta: Secondary | ICD-10-CM | POA: Diagnosis not present

## 2023-03-03 DIAGNOSIS — I10 Essential (primary) hypertension: Secondary | ICD-10-CM | POA: Diagnosis not present

## 2023-03-03 NOTE — Progress Notes (Signed)
 Established Patient Office Visit  Subjective   Patient ID: Marcus Webb, male    DOB: August 29, 1945  Age: 78 y.o. MRN: [id number]  Chief Complaint  Patient presents with   Follow-up    HPI Marcus Webb is a 78 y.o. male presenting today for follow up of hypertension, hyperlipidemia. Hypertension:  Pt denies chest pain, SOB, dizziness, edema, syncope, fatigue or heart palpitations. Taking lisinopril , hydrochlorothiazide , carvedilol , reports excellent compliance with treatment. Denies side effects.  Marcus Webb has been checking his blood pressure at home, at least 90% are at goal. Hyperlipidemia: tolerating rosuvastatin  20 mg daily well with no myalgias or significant side effects.  Marcus Webb acknowledges that his nutritional habits could be better. The ASCVD Risk score (Arnett DK, et al., 2019) failed to calculate for the following reasons:   The valid total cholesterol range is 130 to 320 mg/dL   Outpatient Medications Prior to Visit  Medication Sig   aspirin  EC 81 MG tablet Take 1 tablet (81 mg total) by mouth daily. Swallow whole.   carvedilol  (COREG ) 12.5 MG tablet Take 1 tablet (12.5 mg total) by mouth 2 (two) times daily.   hydrochlorothiazide  (HYDRODIURIL ) 25 MG tablet TAKE 1 TABLET BY MOUTH ONCE DAILY. TAKE IN THE MORNING TIME   lisinopril  (ZESTRIL ) 10 MG tablet TAKE 1 TABLET BY MOUTH AT BEDTIME   pantoprazole  (PROTONIX ) 40 MG tablet Take 1 tablet (40 mg total) by mouth 2 (two) times daily.   rosuvastatin  (CRESTOR ) 20 MG tablet Take 1 tablet by mouth once daily   No facility-administered medications prior to visit.    ROS Negative unless otherwise noted in HPI   Objective:     BP (!) 145/82   Pulse 60   Temp 97.8 F (36.6 C) (Temporal)   Ht 5' 10 (1.778 m)   Wt 240 lb (108.9 kg)   SpO2 95%   BMI 34.44 kg/m   Physical Exam Constitutional:      General: Marcus Webb is not in acute distress.    Appearance: Normal appearance.  HENT:     Head: Normocephalic and atraumatic.   Cardiovascular:     Rate and Rhythm: Normal rate and regular rhythm.     Heart sounds: Normal heart sounds. No murmur heard.    No friction rub. No gallop.  Pulmonary:     Effort: Pulmonary effort is normal. No respiratory distress.     Breath sounds: Normal breath sounds. No wheezing, rhonchi or rales.  Skin:    General: Skin is warm and dry.  Neurological:     Mental Status: Marcus Webb is alert and oriented to person, place, and time.  Psychiatric:        Mood and Affect: Mood normal.      Assessment & Plan:  Benign essential hypertension Assessment & Plan: Also followed by cardiology.  BP goal <130/80.  Blood pressure elevated in office, blood pressure at home at goal.  Continue hydrochlorothiazide  25 mg daily, lisinopril  10 mg daily, carvedilol  12.5 mg twice daily.  Will continue to monitor in coordination with cardiology.   Mixed hyperlipidemia Assessment & Plan: Last lipid panel: LDL 32, HDL 25, triglycerides 208.  Triglycerides have previously been very well-controlled, increased possibly due to recent holiday season.  Continue rosuvastatin  20 mg daily and recheck lipid panel in 6 months.  Will continue to monitor.   Atherosclerosis of aorta [facility]) Assessment & Plan: Continue ASA 81 mg daily, Crestor  20 mg daily, blood pressure medications for AAA and aortic atherosclerosis seen on CT 04/2021.  Next follow-up with cardiology will be in October 2025 with repeat abdominal ultrasound.   Infrarenal abdominal aortic aneurysm (AAA) without rupture [facility]) Assessment & Plan: Followed by cardiology, last office visit 08/2021 reviewed note.  AAA initially seen 09/17/2020.  Still present on CT scan 04/2021, repeat CT abdomen for surveillance October 2025 per cardiology note.  Continue ASA 81 mg daily, Crestor  20 mg daily, blood pressure medications for AAA and aortic atherosclerosis seen on CT 04/2021.  Next follow-up with cardiology October 2025.    Reviewed CBC, CMP, lipid panel. Triglycerides  increased to 208 and HDL further decreased to 25, creatinine low 0.69, BUN/creatinine ratio slightly elevated 25, hemoglobin slightly low 12.9 but improved from 6 months ago.  Return in about 6 months (around 08/31/2023) for follow-up for HTN, HLD, fasting blood work 1 week before.    Joesph DELENA Sear, PA

## 2023-03-03 NOTE — Assessment & Plan Note (Signed)
 Last lipid panel: LDL 32, HDL 25, triglycerides 208.  Triglycerides have previously been very well-controlled, increased possibly due to recent holiday season.  Continue rosuvastatin  20 mg daily and recheck lipid panel in 6 months.  Will continue to monitor.

## 2023-03-03 NOTE — Assessment & Plan Note (Signed)
 Continue ASA 81 mg daily, Crestor 20 mg daily, blood pressure medications for AAA and aortic atherosclerosis seen on CT 04/2021.  Next follow-up with cardiology will be in October 2025 with repeat abdominal ultrasound.

## 2023-03-03 NOTE — Assessment & Plan Note (Signed)
 Followed by cardiology, last office visit 08/2021 reviewed note.  AAA initially seen 09/17/2020.  Still present on CT scan 04/2021, repeat CT abdomen for surveillance October 2025 per cardiology note.  Continue ASA 81 mg daily, Crestor  20 mg daily, blood pressure medications for AAA and aortic atherosclerosis seen on CT 04/2021.  Next follow-up with cardiology October 2025.

## 2023-03-03 NOTE — Assessment & Plan Note (Addendum)
 Also followed by cardiology.  BP goal <130/80.  Blood pressure elevated in office, blood pressure at home at goal.  Continue hydrochlorothiazide  25 mg daily, lisinopril  10 mg daily, carvedilol  12.5 mg twice daily.  Will continue to monitor in coordination with cardiology.

## 2023-03-17 ENCOUNTER — Other Ambulatory Visit: Payer: Self-pay | Admitting: Family Medicine

## 2023-03-17 DIAGNOSIS — K2270 Barrett's esophagus without dysplasia: Secondary | ICD-10-CM

## 2023-04-07 ENCOUNTER — Encounter: Payer: Self-pay | Admitting: Family Medicine

## 2023-05-05 ENCOUNTER — Ambulatory Visit (INDEPENDENT_AMBULATORY_CARE_PROVIDER_SITE_OTHER): Payer: Self-pay

## 2023-05-05 DIAGNOSIS — Z0000 Encounter for general adult medical examination without abnormal findings: Secondary | ICD-10-CM

## 2023-05-05 NOTE — Patient Instructions (Signed)
 Marcus Webb , Thank you for taking time to come for your Medicare Wellness Visit. I appreciate your ongoing commitment to your health goals. Please review the following plan we discussed and let me know if I can assist you in the future.   Referrals/Orders/Follow-Ups/Clinician Recommendations: none  This is a list of the screening recommended for you and due dates:  Health Maintenance  Topic Date Due   COVID-19 Vaccine (1) Never done   Pneumonia Vaccine (1 of 2 - PCV) 04/24/1953   Hepatitis C Screening  Never done   Zoster (Shingles) Vaccine (1 of 2) 04/24/1966   DTaP/Tdap/Td vaccine (2 - Td or Tdap) 02/17/2022   Flu Shot  05/30/2023*   Medicare Annual Wellness Visit  05/04/2024   HPV Vaccine  Aged Out   Colon Cancer Screening  Discontinued  *Topic was postponed. The date shown is not the original due date.    Advanced directives: (Copy Requested) Please bring a copy of your health care power of attorney and living will to the office to be added to your chart at your convenience.  Next Medicare Annual Wellness Visit scheduled for next year: Yes  insert Preventive Care attachment Insert FALL PREVENTION attachment if needed

## 2023-05-05 NOTE — Progress Notes (Signed)
 Subjective:   Marcus Webb is a 78 y.o. who presents for a Medicare Wellness preventive visit.  Visit Complete: Virtual I connected with  Marcus Webb on 05/05/23 by a audio enabled telemedicine application and verified that I am speaking with the correct person using two identifiers.  Patient Location: Home  Provider Location: Home Office  I discussed the limitations of evaluation and management by telemedicine. The patient expressed understanding and agreed to proceed.  Vital Signs: Because this visit was a virtual/telehealth visit, some criteria may be missing or patient reported. Any vitals not documented were not able to be obtained and vitals that have been documented are patient reported.  VideoError- Librarian, academic were attempted between this provider and patient, however failed, due to patient having technical difficulties OR patient did not have access to video capability.  We continued and completed visit with audio only.   AWV Questionnaire: No: Patient Medicare AWV questionnaire was not completed prior to this visit.  Cardiac Risk Factors include: advanced age (>48men, >59 women);dyslipidemia;hypertension;male gender     Objective:    Today's Vitals   There is no height or weight on file to calculate BMI.     05/05/2023   11:30 AM  Advanced Directives  Does Patient Have a Medical Advance Directive? No  Would patient like information on creating a medical advance directive? No - Patient declined    Current Medications (verified) Outpatient Encounter Medications as of 05/05/2023  Medication Sig   aspirin EC 81 MG tablet Take 1 tablet (81 mg total) by mouth daily. Swallow whole.   carvedilol (COREG) 12.5 MG tablet Take 1 tablet (12.5 mg total) by mouth 2 (two) times daily.   hydrochlorothiazide (HYDRODIURIL) 25 MG tablet TAKE 1 TABLET BY MOUTH ONCE DAILY. TAKE IN THE MORNING TIME   lisinopril (ZESTRIL) 10 MG tablet TAKE 1 TABLET BY MOUTH  AT BEDTIME   pantoprazole (PROTONIX) 40 MG tablet Take 1 tablet by mouth twice daily   rosuvastatin (CRESTOR) 20 MG tablet Take 1 tablet by mouth once daily   No facility-administered encounter medications on file as of 05/05/2023.    Allergies (verified) Patient has no known allergies.   History: History reviewed. No pertinent past medical history. History reviewed. No pertinent surgical history. History reviewed. No pertinent family history. Social History   Socioeconomic History   Marital status: Married    Spouse name: Not on file   Number of children: Not on file   Years of education: Not on file   Highest education level: Not on file  Occupational History   Not on file  Tobacco Use   Smoking status: Former    Types: Cigarettes    Passive exposure: Never   Smokeless tobacco: Never  Vaping Use   Vaping status: Never Used  Substance and Sexual Activity   Alcohol use: Not Currently   Drug use: Never   Sexual activity: Not on file  Other Topics Concern   Not on file  Social History Narrative   Not on file   Social Drivers of Health   Financial Resource Strain: Low Risk  (05/05/2023)   Overall Financial Resource Strain (CARDIA)    Difficulty of Paying Living Expenses: Not hard at all  Food Insecurity: No Food Insecurity (05/05/2023)   Hunger Vital Sign    Worried About Running Out of Food in the Last Year: Never true    Ran Out of Food in the Last Year: Never true  Transportation Needs: No  Transportation Needs (05/05/2023)   PRAPARE - Administrator, Civil Service (Medical): No    Lack of Transportation (Non-Medical): No  Physical Activity: Insufficiently Active (05/05/2023)   Exercise Vital Sign    Days of Exercise per Week: 4 days    Minutes of Exercise per Session: 30 min  Stress: No Stress Concern Present (05/05/2023)   [employer] of Occupational Health - Occupational Stress Questionnaire    Feeling of Stress : Not at all  Social Connections:  Moderately Integrated (05/05/2023)   Social Connection and Isolation Panel [NHANES]    Frequency of Communication with Friends and Family: More than three times a week    Frequency of Social Gatherings with Friends and Family: More than three times a week    Attends Religious Services: More than 4 times per year    Active Member of [employer] or Organizations: No    Attends Engineer, structural: Never    Marital Status: Married    Tobacco Counseling Counseling given: Not Answered    Clinical Intake:  Pre-visit preparation completed: Yes  Pain : No/denies pain     Nutritional Risks: None Diabetes: No  How often do you need to have someone help you when you read instructions, pamphlets, or other written materials from your doctor or pharmacy?: 1 - Never  Interpreter Needed?: No  Information entered by :: NAllen LPN   Activities of Daily Living     05/05/2023   11:26 AM  In your present state of health, do you have any difficulty performing the following activities:  Hearing? 1  Comment has hearing aids  Vision? 0  Difficulty concentrating or making decisions? 0  Walking or climbing stairs? 0  Dressing or bathing? 0  Doing errands, shopping? 0  Preparing Food and eating ? N  Using the Toilet? N  In the past six months, have you accidently leaked urine? N  Do you have problems with loss of bowel control? N  Managing your Medications? N  Managing your Finances? N  Housekeeping or managing your Housekeeping? N    Patient Care Team: Melida Quitter, PA as PCP - General (Family Medicine) Jake Bathe, MD as PCP - Cardiology (Cardiology)  Indicate any recent Medical Services you may have received from other than [facility] providers in the past year (date may be approximate).     Assessment:   This is a routine wellness examination for Marcus Webb.  Hearing/Vision screen Hearing Screening - Comments:: Has hearing aids that are maintained Vision Screening - Comments::  Regular eye exams, [facility]   Goals Addressed             This Visit's Progress    Patient Stated       05/05/2023, wants to lose weight and get cholesterol under control       Depression Screen     05/05/2023   11:31 AM 03/03/2023    1:20 PM 03/02/2022    8:31 AM 10/27/2021    8:07 AM 07/02/2021   10:57 AM  PHQ 2/9 Scores  PHQ - 2 Score 0 0 0 0 0  PHQ- 9 Score 0 0 0 0 0    Fall Risk     05/05/2023   11:30 AM 03/03/2023    1:20 PM 03/02/2022    8:32 AM 10/27/2021    8:07 AM 07/02/2021   10:57 AM  Fall Risk   Falls in the past year? 0 0 0 0 0  Number falls in past yr: 0  0 0 0  Injury with Fall? 0  0 0 0  Risk for fall due to : Medication side effect No Fall Risks  No Fall Risks No Fall Risks  Follow up Falls prevention discussed;Falls evaluation completed Falls evaluation completed Falls evaluation completed Falls evaluation completed Falls evaluation completed    MEDICARE RISK AT HOME:  Medicare Risk at Home Any stairs in or around the home?: Yes If so, are there any without handrails?: No Home free of loose throw rugs in walkways, pet beds, electrical cords, etc?: Yes Adequate lighting in your home to reduce risk of falls?: Yes Life alert?: No Use of a cane, walker or w/c?: No Grab bars in the bathroom?: No Shower chair or bench in shower?: No Elevated toilet seat or a handicapped toilet?: Yes  TIMED UP AND GO:  Was the test performed?  No  Cognitive Function: 6CIT completed        05/05/2023   11:32 AM 03/02/2022    8:32 AM  6CIT Screen  What Year? 0 points 0 points  What month? 0 points 0 points  What time? 0 points 0 points  Count back from 20 0 points 0 points  Months in reverse 0 points 0 points  Repeat phrase 0 points 0 points  Total Score 0 points 0 points    Immunizations Immunization History  Administered Date(s) Administered   Influenza-Unspecified 02/04/2012   Pneumococcal-Unspecified 02/04/2012   Tdap 02/18/2012   Zoster, Live  02/18/2012    Screening Tests Health Maintenance  Topic Date Due   COVID-19 Vaccine (1) Never done   Pneumonia Vaccine 25+ Years old (1 of 2 - PCV) 04/24/1953   Hepatitis C Screening  Never done   Zoster Vaccines- Shingrix (1 of 2) 04/24/1966   DTaP/Tdap/Td (2 - Td or Tdap) 02/17/2022   INFLUENZA VACCINE  05/30/2023 (Originally 09/30/2022)   Medicare Annual Wellness (AWV)  05/04/2024   HPV VACCINES  Aged Out   Colonoscopy  Discontinued    Health Maintenance  Health Maintenance Due  Topic Date Due   COVID-19 Vaccine (1) Never done   Pneumonia Vaccine 62+ Years old (1 of 2 - PCV) 04/24/1953   Hepatitis C Screening  Never done   Zoster Vaccines- Shingrix (1 of 2) 04/24/1966   DTaP/Tdap/Td (2 - Td or Tdap) 02/17/2022   Health Maintenance Items Addressed: Due for TDAP. States had pneumonia  at [facility] which has closed and shingles vaccine at [place] in [place]. Hep C due  Additional Screening:  Vision Screening: Recommended annual ophthalmology exams for early detection of glaucoma and other disorders of the eye.  Dental Screening: Recommended annual dental exams for proper oral hygiene  Community Resource Referral / Chronic Care Management: CRR required this visit?  No   CCM required this visit?  No     Plan:     I have personally reviewed and noted the following in the patient's chart:   Medical and social history Use of alcohol, tobacco or illicit drugs  Current medications and supplements including opioid prescriptions. Patient is not currently taking opioid prescriptions. Functional ability and status Nutritional status Physical activity Advanced directives List of other physicians Hospitalizations, surgeries, and ER visits in previous 12 months Vitals Screenings to include cognitive, depression, and falls Referrals and appointments  In addition, I have reviewed and discussed with patient certain preventive protocols, quality metrics, and best  practice recommendations. A written personalized care plan for preventive  services as well as general preventive health recommendations were provided to patient.     Barb Merino, LPN   07/03/979   After Visit Summary: (MyChart) Due to this being a telephonic visit, the after visit summary with patients personalized plan was offered to patient via MyChart   Notes: Nothing significant to report at this time.

## 2023-06-13 ENCOUNTER — Other Ambulatory Visit: Payer: Self-pay | Admitting: Family Medicine

## 2023-06-13 DIAGNOSIS — K2270 Barrett's esophagus without dysplasia: Secondary | ICD-10-CM

## 2023-06-13 IMAGING — CT CT CTA ABD/PEL W/CM AND/OR W/O CM
2 of 6 series · 15 of 46 positions shown, 17 images · IV contrast (OMNIPAQUE 350)
Comparison: 10/01/2020 and previous

CLINICAL DATA: Aortic aneurysm surveillance, currently asymptomatic

EXAM:
CTA ABDOMEN AND PELVIS WITHOUT AND WITH CONTRAST
TECHNIQUE: Multidetector CT imaging of the abdomen and pelvis was performed
using the standard protocol during bolus administration of
intravenous contrast. Multiplanar reconstructed images and MIPs were
obtained and reviewed to evaluate the vascular anatomy.

[Series 5: arterial 3.0 br40 2 · axial · arterial · 0.82mm/px · z∈[-416,+1]mm · 12 of 165 slices shown, 14 images]
[im 13/165  soft-tissue]
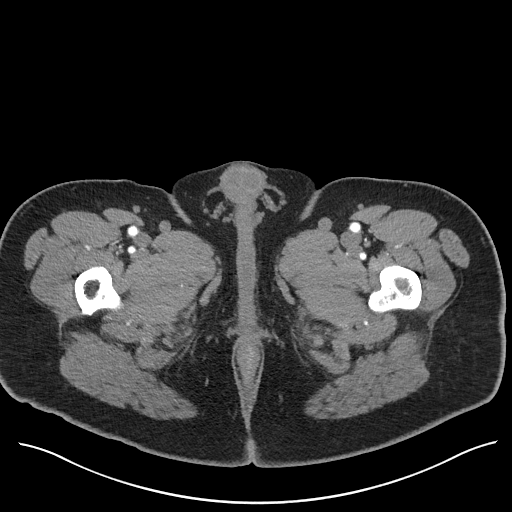
[im 13/165  bone]
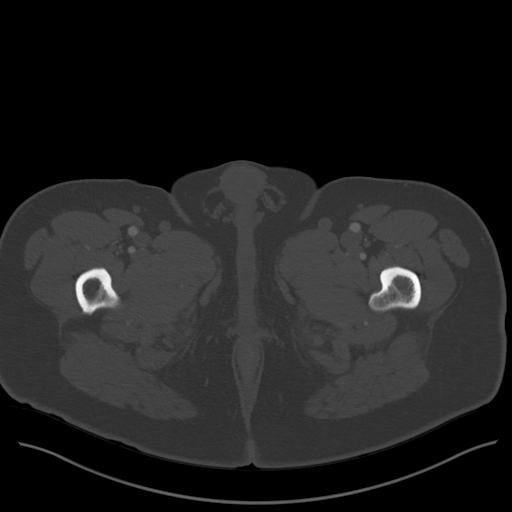
[im 25/165  soft-tissue]
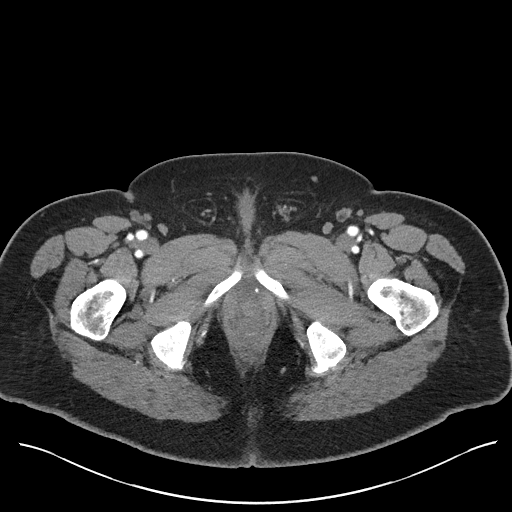
[im 37/165  soft-tissue]
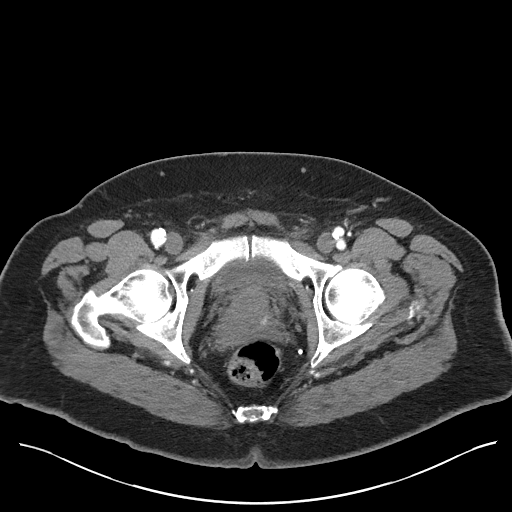
[im 49/165  soft-tissue]
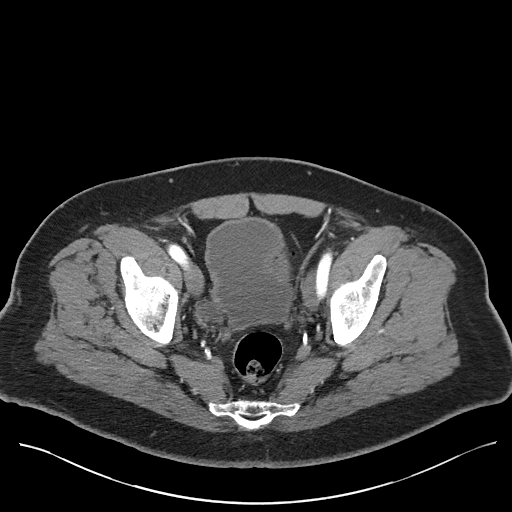
[im 61/165  soft-tissue]
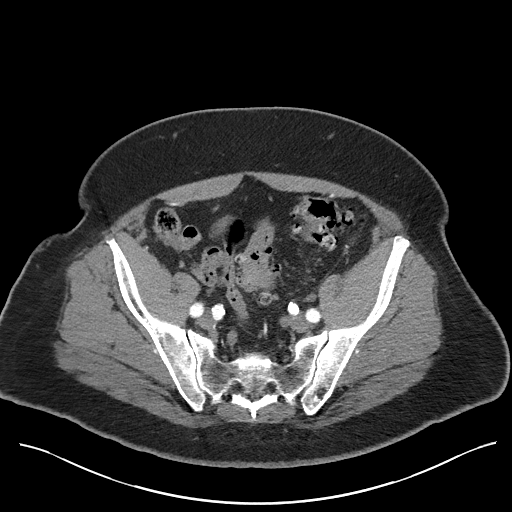
[im 73/165  soft-tissue]
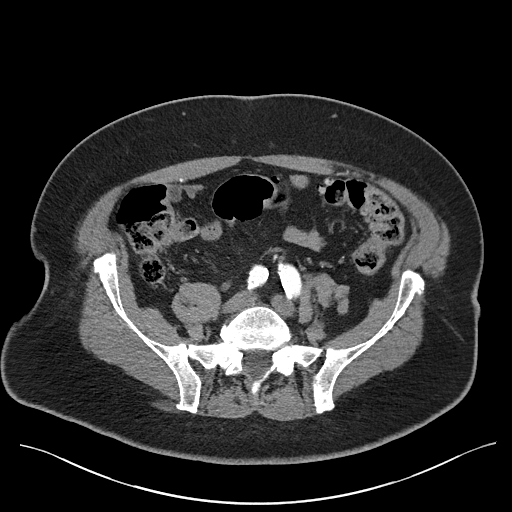
[im 92/165  soft-tissue]
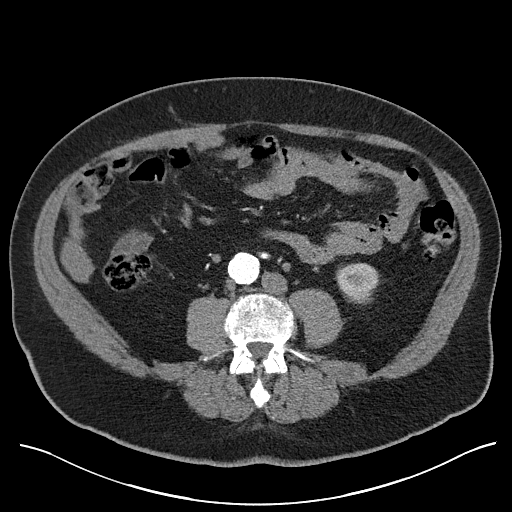
[im 104/165  soft-tissue]
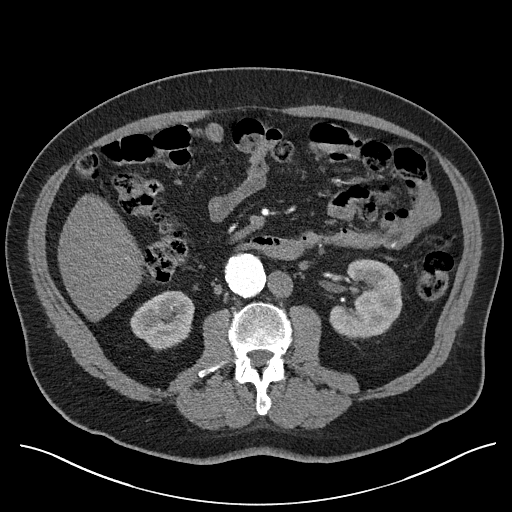
[im 116/165  soft-tissue]
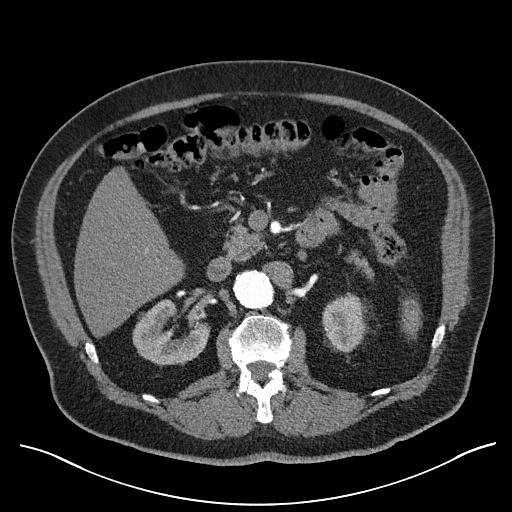
[im 116/165  bone]
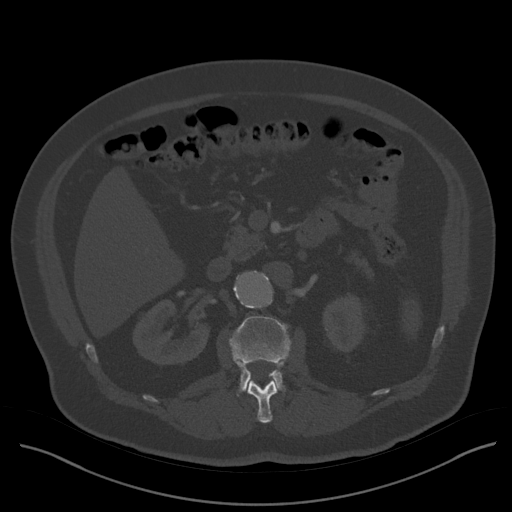
[im 128/165  soft-tissue]
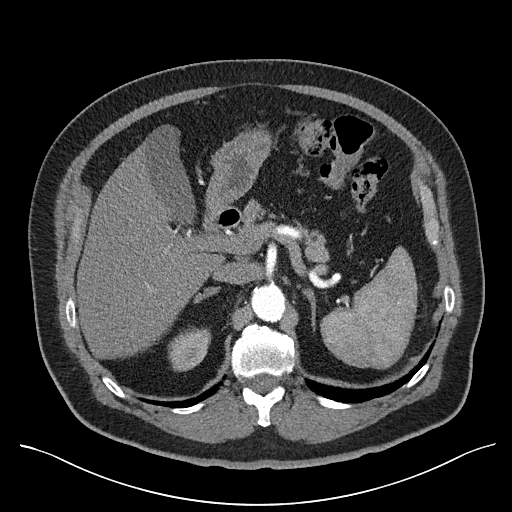
[im 140/165  soft-tissue]
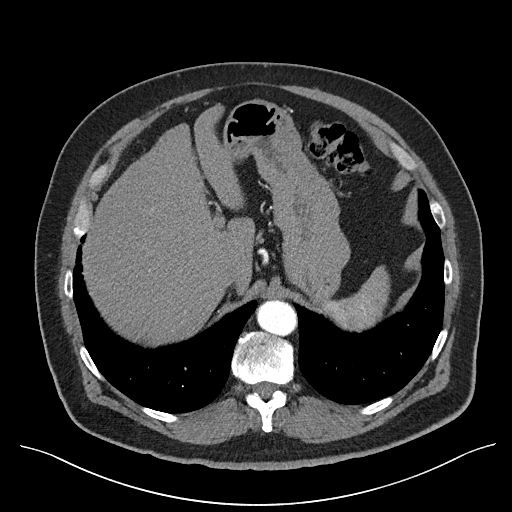
[im 152/165  soft-tissue]
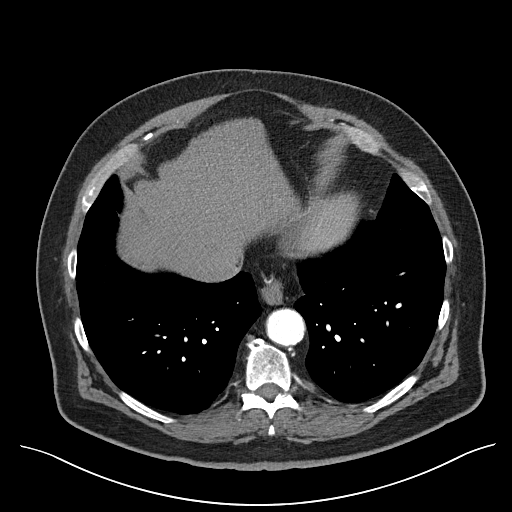

[Series 8: coronals · coronal · 0.80mm/px · 3 of 143 slices shown]
[im 36/143  soft-tissue]
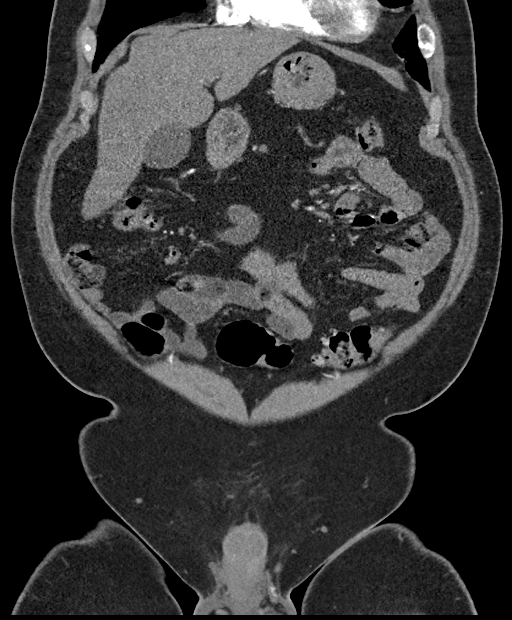
[im 72/143  soft-tissue]
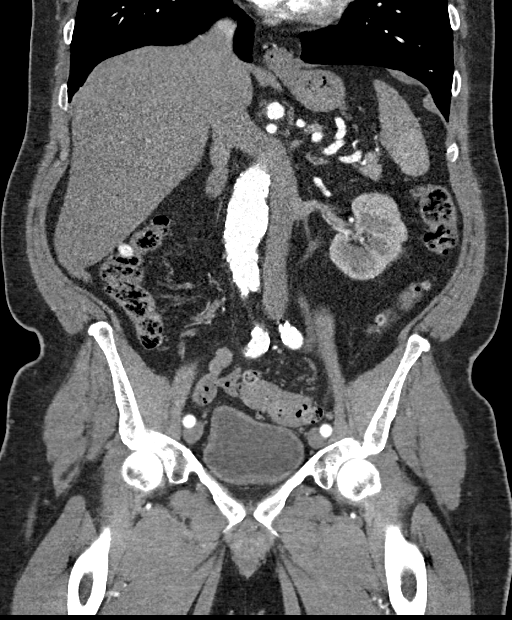
[im 107/143  soft-tissue]
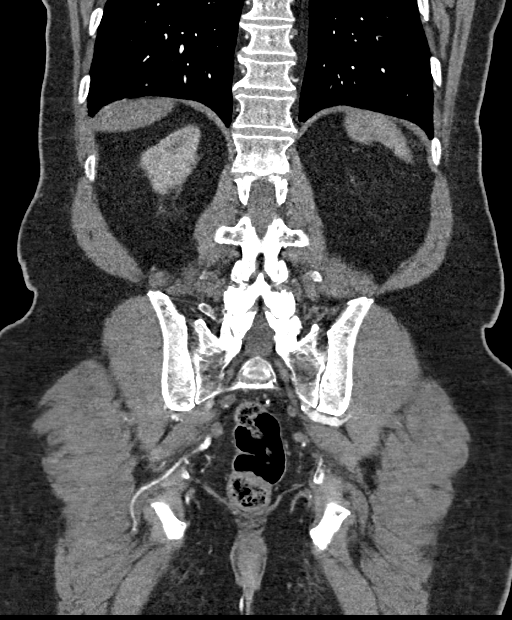

[15 of 46 positions shown; findings below may reference images not displayed]

RADIATION DOSE REDUCTION: This exam was performed according to the
departmental dose-optimization program which includes automated
exposure control, adjustment of the mA and/or kV according to
patient size and/or use of iterative reconstruction technique.

CONTRAST:  100mL OMNIPAQUE IOHEXOL 350 MG/ML SOLN
FINDINGS: VASCULAR

Aorta: Moderate calcified atheromatous plaque. No dissection or
stenosis. 3.6 cm fusiform infrarenal aneurysm (previously 3.5). No
evidence of rupture.

Celiac: Stable chronic dissection with 1.3 cm fusiform dilatation of
the distal celiac axis. Patent branch anatomy.

SMA: Calcified ostial plaque resulting in short segment stenosis of
at least mild severity, patent distally with classic distal branch
anatomy.

Renals: Single bilaterally, both with calcified ostial plaque
resulting in short segment stenosis of at least mild severity,
patent distally.

IMA: Patent without evidence of aneurysm, dissection, vasculitis or
significant stenosis.

Inflow: Moderate calcified plaque. 1.7 cm fusiform dilatation left
common iliac artery. No dissection or stenosis.

Proximal Outflow: Mildly atheromatous, patent

Veins: Left infrarenal IVC, an anatomic variant. No obvious venous
abnormality within the limitations of this arterial phase study.

Review of the MIP images confirms the above findings.

NON-VASCULAR

Lower chest: No pleural or pericardial effusion.

Hepatobiliary: No focal liver abnormality is seen. No gallstones,
gallbladder wall thickening, or biliary dilatation.

Pancreas: Unremarkable. No pancreatic ductal dilatation or
surrounding inflammatory changes.

Spleen: Normal in size without focal abnormality.

Adrenals/Urinary Tract: No adrenal mass. Small renal cysts, present
since 11/20/2015. No hydronephrosis. Urinary bladder partially
distended, mildly thick-walled, with right posterolateral
diverticulum.

Stomach/Bowel: Stomach is nondistended, unremarkable. Small bowel
decompressed. Appendix not identified. Colon is nondilated with
scattered diverticula, most numerous in the sigmoid segment, with
wall thickening. No significant adjacent inflammatory changes.

Lymphatic: No abdominal or pelvic adenopathy.

Reproductive: Mild prostate enlargement.

Other: Bilateral pelvic phleboliths.  No ascites.  No free air.

Musculoskeletal: Mild thoracolumbar spondylitic changes. No fracture
or worrisome bone lesion.
IMPRESSION: 1. 3.6 cm infrarenal abdominal aortic aneurysm (previously 3.5).
Recommend follow-up ultrasound every 2 years. This recommendation
follows ACR consensus guidelines: White Paper of the ACR Incidental
Findings Committee II on Vascular Findings. [HOSPITAL] 0794;
[DATE].
2. 1.7 cm fusiform dilatation of the mid left common iliac artery
(previously 1.5).
3. Stable 1.3 cm fusiform dilatation of the celiac axis with chronic
dissection.
4. Colonic diverticulosis.

## 2023-08-19 ENCOUNTER — Other Ambulatory Visit: Payer: Self-pay | Admitting: *Deleted

## 2023-08-19 DIAGNOSIS — I10 Essential (primary) hypertension: Secondary | ICD-10-CM

## 2023-08-19 DIAGNOSIS — E782 Mixed hyperlipidemia: Secondary | ICD-10-CM

## 2023-08-19 DIAGNOSIS — Z6833 Body mass index (BMI) 33.0-33.9, adult: Secondary | ICD-10-CM

## 2023-08-19 DIAGNOSIS — E0590 Thyrotoxicosis, unspecified without thyrotoxic crisis or storm: Secondary | ICD-10-CM

## 2023-08-19 DIAGNOSIS — E66811 Obesity, class 1: Secondary | ICD-10-CM

## 2023-08-29 ENCOUNTER — Other Ambulatory Visit: Payer: Medicare HMO

## 2023-08-29 DIAGNOSIS — Z6833 Body mass index (BMI) 33.0-33.9, adult: Secondary | ICD-10-CM | POA: Diagnosis not present

## 2023-08-29 DIAGNOSIS — E0590 Thyrotoxicosis, unspecified without thyrotoxic crisis or storm: Secondary | ICD-10-CM | POA: Diagnosis not present

## 2023-08-29 DIAGNOSIS — E782 Mixed hyperlipidemia: Secondary | ICD-10-CM

## 2023-08-29 DIAGNOSIS — I10 Essential (primary) hypertension: Secondary | ICD-10-CM | POA: Diagnosis not present

## 2023-08-29 DIAGNOSIS — E66811 Obesity, class 1: Secondary | ICD-10-CM | POA: Diagnosis not present

## 2023-08-30 ENCOUNTER — Other Ambulatory Visit: Payer: Self-pay

## 2023-08-30 ENCOUNTER — Ambulatory Visit: Payer: Self-pay | Admitting: Family Medicine

## 2023-08-30 DIAGNOSIS — E785 Hyperlipidemia, unspecified: Secondary | ICD-10-CM

## 2023-08-30 DIAGNOSIS — I714 Abdominal aortic aneurysm, without rupture, unspecified: Secondary | ICD-10-CM

## 2023-08-30 DIAGNOSIS — I10 Essential (primary) hypertension: Secondary | ICD-10-CM

## 2023-08-30 DIAGNOSIS — Z79899 Other long term (current) drug therapy: Secondary | ICD-10-CM

## 2023-08-30 DIAGNOSIS — R011 Cardiac murmur, unspecified: Secondary | ICD-10-CM

## 2023-08-30 LAB — CBC WITH DIFFERENTIAL/PLATELET
Basophils Absolute: 0 10*3/uL (ref 0.0–0.2)
Basos: 1 %
EOS (ABSOLUTE): 0.2 10*3/uL (ref 0.0–0.4)
Eos: 3 %
Hematocrit: 40.4 % (ref 37.5–51.0)
Hemoglobin: 13.2 g/dL (ref 13.0–17.7)
Immature Grans (Abs): 0 10*3/uL (ref 0.0–0.1)
Immature Granulocytes: 0 %
Lymphocytes Absolute: 0.9 10*3/uL (ref 0.7–3.1)
Lymphs: 16 %
MCH: 31.4 pg (ref 26.6–33.0)
MCHC: 32.7 g/dL (ref 31.5–35.7)
MCV: 96 fL (ref 79–97)
Monocytes Absolute: 0.8 10*3/uL (ref 0.1–0.9)
Monocytes: 14 %
Neutrophils Absolute: 3.7 10*3/uL (ref 1.4–7.0)
Neutrophils: 66 %
Platelets: 168 10*3/uL (ref 150–450)
RBC: 4.2 x10E6/uL (ref 4.14–5.80)
RDW: 13.6 % (ref 11.6–15.4)
WBC: 5.6 10*3/uL (ref 3.4–10.8)

## 2023-08-30 LAB — COMPREHENSIVE METABOLIC PANEL WITH GFR
ALT: 36 IU/L (ref 0–44)
AST: 25 IU/L (ref 0–40)
Albumin: 4.3 g/dL (ref 3.8–4.8)
Alkaline Phosphatase: 58 IU/L (ref 44–121)
BUN/Creatinine Ratio: 22 (ref 10–24)
BUN: 17 mg/dL (ref 8–27)
Bilirubin Total: 0.4 mg/dL (ref 0.0–1.2)
CO2: 25 mmol/L (ref 20–29)
Calcium: 9 mg/dL (ref 8.6–10.2)
Chloride: 99 mmol/L (ref 96–106)
Creatinine, Ser: 0.77 mg/dL (ref 0.76–1.27)
Globulin, Total: 2.1 g/dL (ref 1.5–4.5)
Glucose: 102 mg/dL — ABNORMAL HIGH (ref 70–99)
Potassium: 3.4 mmol/L — ABNORMAL LOW (ref 3.5–5.2)
Sodium: 141 mmol/L (ref 134–144)
Total Protein: 6.4 g/dL (ref 6.0–8.5)
eGFR: 93 mL/min/{1.73_m2} (ref >59)

## 2023-08-30 LAB — LIPID PANEL
Chol/HDL Ratio: 3.7 ratio (ref 0.0–5.0)
Cholesterol, Total: 88 mg/dL — ABNORMAL LOW (ref 100–199)
HDL: 24 mg/dL — ABNORMAL LOW (ref >39)
LDL Chol Calc (NIH): 32 mg/dL (ref 0–99)
Triglycerides: 196 mg/dL — ABNORMAL HIGH (ref 0–149)
VLDL Cholesterol Cal: 32 mg/dL (ref 5–40)

## 2023-08-30 LAB — HEMOGLOBIN A1C
Est. average glucose Bld gHb Est-mCnc: 117 mg/dL
Hgb A1c MFr Bld: 5.7 % — ABNORMAL HIGH (ref 4.8–5.6)

## 2023-08-30 LAB — TSH: TSH: 1.85 u[IU]/mL (ref 0.450–4.500)

## 2023-08-30 MED ORDER — HYDROCHLOROTHIAZIDE 25 MG PO TABS: 25.0000 mg | ORAL_TABLET | Freq: Every day | ORAL | 0 refills | Status: DC

## 2023-08-30 MED ORDER — LISINOPRIL 10 MG PO TABS: 10.0000 mg | ORAL_TABLET | Freq: Every day | ORAL | 0 refills | Status: DC

## 2023-08-30 MED ORDER — ROSUVASTATIN CALCIUM 20 MG PO TABS: 20.0000 mg | ORAL_TABLET | Freq: Every day | ORAL | 0 refills | Status: DC

## 2023-08-30 MED ORDER — CARVEDILOL 12.5 MG PO TABS: 12.5000 mg | ORAL_TABLET | Freq: Two times a day (BID) | ORAL | 0 refills | Status: DC

## 2023-09-06 ENCOUNTER — Ambulatory Visit: Payer: Medicare HMO | Admitting: Family Medicine

## 2023-09-07 ENCOUNTER — Ambulatory Visit (INDEPENDENT_AMBULATORY_CARE_PROVIDER_SITE_OTHER): Admitting: Family Medicine

## 2023-09-07 ENCOUNTER — Encounter: Payer: Self-pay | Admitting: Family Medicine

## 2023-09-07 VITALS — BP 125/76 | HR 58 | Ht 70.0 in | Wt 235.1 lb

## 2023-09-07 DIAGNOSIS — E782 Mixed hyperlipidemia: Secondary | ICD-10-CM | POA: Diagnosis not present

## 2023-09-07 DIAGNOSIS — K219 Gastro-esophageal reflux disease without esophagitis: Secondary | ICD-10-CM

## 2023-09-07 DIAGNOSIS — I10 Essential (primary) hypertension: Secondary | ICD-10-CM

## 2023-09-07 DIAGNOSIS — R7303 Prediabetes: Secondary | ICD-10-CM

## 2023-09-07 DIAGNOSIS — K2270 Barrett's esophagus without dysplasia: Secondary | ICD-10-CM

## 2023-09-07 MED ORDER — PANTOPRAZOLE SODIUM 40 MG PO TBEC: 40.0000 mg | DELAYED_RELEASE_TABLET | Freq: Two times a day (BID) | ORAL | 3 refills | Status: AC

## 2023-09-07 NOTE — Assessment & Plan Note (Signed)
 Patient requests a 90-day supply of pantoprazole  with refills for one year for convenience and travel. Reports GERD is well-controlled on the current dose of 40 mg twice daily. Attempts to decrease dosing have been unsuccessful.  - Sent 90-day supply of pantoprazole  40 mg twice daily with 3 refills to [facility].

## 2023-09-07 NOTE — Assessment & Plan Note (Signed)
 History of high blood pressure, which is better controlled since adjusting medication timing. Home BPs are reasonable. No changes to antihypertensive regimen at this time. - Continue current management. - Follow up with cardiology as scheduled.

## 2023-09-07 NOTE — Patient Instructions (Signed)
 It was nice to see you today,  We addressed the following topics today: -I have refilled your pantoprazole    for 90 days with enough refills to last year. - You can see me sometime next year between 9 and 12 months from now for your physical.  We can start seeing you yearly for your physicals and then if you have acute issues in between we can see you for that.  Have a great day,  Rolan Slain, MD

## 2023-09-07 NOTE — Progress Notes (Signed)
   Established Patient Office Visit  Subjective   Patient ID: Marcus Webb, male    DOB: 1945/09/05  Age: 78 y.o. MRN: [id number]  Chief Complaint  Patient presents with   Medical Management of Chronic Issues    HPI  Subjective - Established patient, new to me, for a 2-month follow-up of chronic medical issues. Previously seen by Joes[facility]. No new concerns today. - Requests 90-day supply of pantoprazole  with refills for a year due to travel. Currently takes 40 mg twice daily for reflux for about one year. Reports symptoms return on a lower dose. - Prefers to be seen annually instead of every 6 months for physicals. - Reports checking blood pressure at home, which is typically better than in-office readings. Today at 11:00 am, BP was 136/76 with a heart rate of 65. Mentions previous issues with high morning BP, which improved after taking one of the medications at night. - Has an upcoming appointment with his cardiologist, Dr. Burnette, in October. - States he has had the shingles vaccine. Status of pneumococcal vaccine unclear, likely needs updating.  Medications: Pantoprazole  40 mg twice daily, Crestor , lisinopril , hydrochlorothiazide , carvedilol , and aspirin . No changes reported.  PMH, PSH, FH, Social Hx: History of reflux, hypertension, and hyperlipidemia. Follows with cardiology and gastroenterology.  ROS: No new complaints. Denies symptoms when on current dose of pantoprazole .    The ASCVD Risk score (Arnett DK, et al., 2019) failed to calculate for the following reasons:   The valid total cholesterol range is 130 to 320 mg/dL  Health Maintenance Due  Topic Date Due   COVID-19 Vaccine (1) Never done   Hepatitis C Screening  Never done   Pneumococcal Vaccine: 50+ Years (1 of 2 - PCV) 04/24/1966   Zoster Vaccines- Shingrix (1 of 2) 04/24/1966   DTaP/Tdap/Td (2 - Td or Tdap) 02/17/2022      Objective:     BP (!) 143/75   Pulse (!) 58   Ht 5' 10 (1.778 m)   Wt 235 lb 1.9 oz  (106.6 kg)   SpO2 95%   BMI 33.74 kg/m    Physical Exam General: Alert, oriented HEENT: Exotropia Pulmonary: No respiratory distress Psych: Pleasant affect no what is,   No results found for any visits on 09/07/23.      Assessment & Plan:   Gastroesophageal reflux disease, unspecified whether esophagitis present Assessment & Plan: Patient requests a 90-day supply of pantoprazole  with refills for one year for convenience and travel. Reports GERD is well-controlled on the current dose of 40 mg twice daily. Attempts to decrease dosing have been unsuccessful.  - Sent 90-day supply of pantoprazole  40 mg twice daily with 3 refills to [facility].   Barrett's esophagus without dysplasia -     Pantoprazole  Sodium; Take 1 tablet (40 mg total) by mouth 2 (two) times daily.  Dispense: 180 tablet; Refill: 3  Benign essential hypertension Assessment & Plan: History of high blood pressure, which is better controlled since adjusting medication timing. Home BPs are reasonable. No changes to antihypertensive regimen at this time. - Continue current management. - Follow up with cardiology as scheduled.      Return in about 9 months (around 06/07/2024) for physical.    Toribio MARLA Slain, MD

## 2023-09-09 NOTE — Addendum Note (Signed)
 Addended by: CHANDRA TORIBIO POUR on: 09/09/2023 02:42 PM   Modules accepted: Orders

## 2023-11-25 ENCOUNTER — Ambulatory Visit (HOSPITAL_BASED_OUTPATIENT_CLINIC_OR_DEPARTMENT_OTHER)
Admission: RE | Admit: 2023-11-25 | Discharge: 2023-11-25 | Disposition: A | Discharge status: Home / Self Care | Source: Ambulatory Visit | Attending: Cardiology | Admitting: Cardiology

## 2023-11-25 ENCOUNTER — Ambulatory Visit (HOSPITAL_COMMUNITY)
Admission: RE | Admit: 2023-11-25 | Discharge: 2023-11-25 | Disposition: A | Discharge status: Home / Self Care | Source: Ambulatory Visit | Attending: Cardiology | Admitting: Cardiology

## 2023-11-25 ENCOUNTER — Ambulatory Visit: Payer: Self-pay | Admitting: Cardiology

## 2023-11-25 DIAGNOSIS — I77819 Aortic ectasia, unspecified site: Secondary | ICD-10-CM | POA: Insufficient documentation

## 2023-11-25 DIAGNOSIS — I350 Nonrheumatic aortic (valve) stenosis: Secondary | ICD-10-CM | POA: Diagnosis not present

## 2023-11-25 LAB — ECHOCARDIOGRAM COMPLETE
AR max vel: 1.76 cm2
AV Area VTI: 1.77 cm2
AV Area mean vel: 1.71 cm2
AV Mean grad: 16.2 mmHg
AV Peak grad: 30.3 mmHg
Ao pk vel: 2.75 m/s
Area-P 1/2: 3.21 cm2
S' Lateral: 3.2 cm

## 2023-12-05 ENCOUNTER — Ambulatory Visit: Admission: EM | Admit: 2023-12-05 | Discharge: 2023-12-05 | Disposition: A | Discharge status: Home / Self Care

## 2023-12-05 DIAGNOSIS — L02212 Cutaneous abscess of back [any part, except buttock]: Secondary | ICD-10-CM | POA: Diagnosis not present

## 2023-12-05 MED ORDER — DOXYCYCLINE HYCLATE 100 MG PO CAPS: 100.0000 mg | ORAL_CAPSULE | Freq: Two times a day (BID) | ORAL | 0 refills | Status: DC

## 2023-12-05 NOTE — ED Triage Notes (Signed)
 Patient reports noticing a lump in the upper to mid-back area. Initially, when the area was manipulated, some drainage occurred. The patient indicates the lesion is healing, but with frequent manipulation, bruising has developed around the site, and occasional drainage persists. No fever has been reported. The patient was unable to schedule an appointment with their primary care provider for over a week and is seeking earlier intervention due to concerns.

## 2023-12-05 NOTE — ED Provider Notes (Signed)
 [facility]  Note:  This document was prepared using Conservation officer, historic buildings and may include unintentional dictation errors.  MRN: [id number] DOB: Dec 04, 1945  Subjective:   Marcus Webb is a 78 y.o. male presenting for a painful lump on his upper back that has been draining purulent discharge.  Patient reports that initially started as a erythematous bump as he pushed on it eventually he started getting purulent drainage from the site.  Patient reports that drainage has continued and now the area is extremely bruised and has increased tenderness with palpation.  Patient denies any fever but does admit to significant purulent drainage and pain with palpation.   No current facility-administered medications for this encounter.  Current Outpatient Medications:    doxycycline (VIBRAMYCIN) 100 MG capsule, Take 1 capsule (100 mg total) by mouth 2 (two) times daily., Disp: 20 capsule, Rfl: 0   ibuprofen (ADVIL) 400 MG tablet, Take 400 mg by mouth every 6 (six) hours as needed., Disp: , Rfl:    neomycin-bacitracin-polymyxin (NEOSPORIN) [phone] ointment, Apply topically 4 (four) times daily., Disp: , Rfl:    aspirin  EC 81 MG tablet, Take 1 tablet (81 mg total) by mouth daily. Swallow whole., Disp: 90 tablet, Rfl: 3   carvedilol  (COREG ) 12.5 MG tablet, Take 1 tablet (12.5 mg total) by mouth 2 (two) times daily., Disp: 180 tablet, Rfl: 0   Cholecalciferol 50 MCG (2000 UT) CAPS, 1 capsule., Disp: , Rfl:    hydrochlorothiazide  (HYDRODIURIL ) 25 MG tablet, Take 1 tablet (25 mg total) by mouth daily. Take in the morning time, Disp: 90 tablet, Rfl: 0   hydrochlorothiazide  (HYDRODIURIL ) 25 MG tablet, Take 25 mg by mouth daily., Disp: , Rfl:    lisinopril  (ZESTRIL ) 10 MG tablet, Take 1 tablet (10 mg total) by mouth at bedtime., Disp: 90 tablet, Rfl: 0   lisinopril  (ZESTRIL ) 10 MG tablet, Take 10 mg by mouth daily., Disp: , Rfl:    metoprolol succinate (TOPROL-XL) 100 MG 24 hr tablet, 1  tablet Orally Once a day; Duration: 30 day(s), Disp: , Rfl:    Omega-3 Fatty Acids (FISH OIL) 1000 MG CAPS, 1 capsule., Disp: , Rfl:    pantoprazole  (PROTONIX ) 40 MG tablet, Take 1 tablet (40 mg total) by mouth 2 (two) times daily., Disp: 180 tablet, Rfl: 3   rosuvastatin  (CRESTOR ) 20 MG tablet, Take 1 tablet (20 mg total) by mouth daily., Disp: 90 tablet, Rfl: 0   rosuvastatin  (CRESTOR ) 20 MG tablet, Take 20 mg by mouth daily., Disp: , Rfl:    No Known Allergies  History reviewed. No pertinent past medical history.   History reviewed. No pertinent surgical history.  History reviewed. No pertinent family history.  Social History   Tobacco Use   Smoking status: Former    Types: Cigarettes    Passive exposure: Never   Smokeless tobacco: Never  Vaping Use   Vaping status: Never Used  Substance Use Topics   Alcohol use: Not Currently   Drug use: Never    ROS Refer to HPI for ROS details.  Objective:   Vitals: BP (!) 162/73 (BP Location: Left Arm)   Pulse 61   Temp 98.4 F (36.9 C) (Oral)   Resp 18   Ht 5' 10 (1.778 m)   Wt 230 lb (104.3 kg)   SpO2 95%   BMI 33.00 kg/m   Physical Exam Vitals and nursing note reviewed.  Constitutional:      General: He is not in acute distress.  Appearance: Normal appearance. He is well-developed. He is not ill-appearing or toxic-appearing.  HENT:     Head: Normocephalic.  Cardiovascular:     Rate and Rhythm: Normal rate.  Pulmonary:     Effort: Pulmonary effort is normal. No respiratory distress.  Skin:    General: Skin is warm and dry.     Findings: Abscess, bruising, erythema and wound present.      Neurological:     General: No focal deficit present.     Mental Status: He is alert and oriented to person, place, and time.  Psychiatric:        Mood and Affect: Mood normal.        Behavior: Behavior normal.     Procedures  No results found for this or any previous visit (from the past 24 hours).  No results  found.   Assessment and Plan :     Discharge Instructions       1. Cutaneous abscess of back excluding buttocks (Primary) - doxycycline (VIBRAMYCIN) 100 MG capsule; Take 1 capsule (100 mg total) by mouth 2 (two) times daily.  Dispense: 20 capsule; Refill: 0 - Aerobic Culture w Gram Stain (superficial specimen) collected in UC and sent to lab for further testing results should be available in 2 to 3 days. - Continue to monitor site for any change in severity of symptoms, if you develop any new symptoms or current symptoms become worse follow-up for further evaluation and treatment.       Hopie Pellegrin B Yahir Tavano   Roshni Burbano, [place], [place] 12/05/23 1307

## 2023-12-05 NOTE — Discharge Instructions (Signed)
  1. Cutaneous abscess of back excluding buttocks (Primary) - doxycycline (VIBRAMYCIN) 100 MG capsule; Take 1 capsule (100 mg total) by mouth 2 (two) times daily.  Dispense: 20 capsule; Refill: 0 - Aerobic Culture w Gram Stain (superficial specimen) collected in UC and sent to lab for further testing results should be available in 2 to 3 days. - Continue to monitor site for any change in severity of symptoms, if you develop any new symptoms or current symptoms become worse follow-up for further evaluation and treatment.

## 2023-12-08 ENCOUNTER — Ambulatory Visit (HOSPITAL_COMMUNITY): Payer: Self-pay

## 2023-12-08 LAB — AEROBIC CULTURE W GRAM STAIN (SUPERFICIAL SPECIMEN)
Culture: NO GROWTH
Special Requests: NORMAL

## 2023-12-12 ENCOUNTER — Ambulatory Visit (INDEPENDENT_AMBULATORY_CARE_PROVIDER_SITE_OTHER): Admitting: Family Medicine

## 2023-12-12 ENCOUNTER — Encounter: Payer: Self-pay | Admitting: Family Medicine

## 2023-12-12 VITALS — BP 144/89 | HR 60 | Ht 70.0 in | Wt 235.4 lb

## 2023-12-12 DIAGNOSIS — L729 Follicular cyst of the skin and subcutaneous tissue, unspecified: Secondary | ICD-10-CM

## 2023-12-12 DIAGNOSIS — I10 Essential (primary) hypertension: Secondary | ICD-10-CM

## 2023-12-12 NOTE — Patient Instructions (Signed)
 It was nice to see you today,  We addressed the following topics today: -Continue taking your antibiotics until the prescription runs out - I would like you to wash the area with soap and water in the morning, then apply a layer of Vaseline over it.  Before bed I would like you to then wash the area again with soap and water and repeat the Vaseline layer. - I will see you back in approximately 1 month.  At that time we can decide if any further workup needs to be done. - If it starts to look like it is infected again, such as it becomes extremely tender, warm to touch, swollen or red let us  know sooner and I will look at it. - Please check your blood pressure at home 1-2 times a day for at least 1 to 2 weeks and then bring those results back to us .  You can bring them back at your next visit if you would like.  Have a great day,  Rolan Slain, MD

## 2023-12-12 NOTE — Progress Notes (Unsigned)
   Acute Office Visit  Subjective:     Patient ID: Marcus Webb, male    DOB: 1945-08-08, 78 y.o.   MRN: [id number]  Chief Complaint  Patient presents with   soar on back    HPI Patient is in today for   Subjective - Follow-up for a spot on the back treated at urgent care. Reports the spot is no longer bothering him and does not hurt. He was manipulating the lesion himself, which caused some damage. States it was draining when he went to urgent care. Urgent care obtained a culture which showed no growth. The lesion is no longer draining.  Medications Medications include doxycycline, 10-day course will complete this Wednesday   PMH, PSH, FH, Social Hx PMHx: Hypertension, reports home BP this morning was 130/80. Denies seeing a dermatologist.   ROS Integumentary: Reports a spot on his back, previously painful and swollen, now improved. Denies current pain or drainage. Constitutional: Denies fever.   ROS      Objective:    BP (!) 144/89   Pulse 60   Ht 5' 10 (1.778 m)   Wt 235 lb 6.4 oz (106.8 kg)   SpO2 97%   BMI 33.78 kg/m    Physical Exam Gen: alert, oriented Pulm: no respiratory distress Psych: pleasant affect SKIN: Examination of the back reveals a lesion with surrounding induration and bruising. A central area appears fluctuant.    No results found for any visits on 12/12/23.      Assessment & Plan:   Subcutaneous cyst Assessment & Plan: Patient presents for follow-up of a lesion on his back, likely a ruptured and infected cyst. He was treated at an urgent care center with a 10-day course of doxycycline, which he will complete in 2 days. Examination today reveals an indurated lesion with central area of minimal fluctuance and surrounding ecchymosis, likely from manipulation. The lesion appears to be resolving. A photo was taken and added to the chart. - Wash the area with soap and water and Apply Vaseline to the lesion twice daily. - Discontinue  Neosporin. - advised not to manipulate or squeeze the lesion. - If the lump persists after one month without pain, drainage, or increasing size, will order an ultrasound to evaluate for a persistent cyst. Discussed potential for removal  if it reforms.   Benign essential hypertension Assessment & Plan: Blood pressure was elevated in the office today. Patient reports monitoring at home with recent readings around 130/80. He has an upcoming cardiology appointment on 12/17/2023. - Patient to bring a log of home blood pressure readings to the next appointment. - Follow-up in 4-6 weeks to recheck blood pressure and re-evaluate the back lesion. Will see after cardiology appointment to review their assessment.      Return in about 6 weeks (around 01/23/2024) for HTN.  Toribio MARLA Slain, MD

## 2023-12-13 DIAGNOSIS — L729 Follicular cyst of the skin and subcutaneous tissue, unspecified: Secondary | ICD-10-CM | POA: Insufficient documentation

## 2023-12-13 NOTE — Assessment & Plan Note (Signed)
 Patient presents for follow-up of a lesion on his back, likely a ruptured and infected cyst. He was treated at an urgent care center with a 10-day course of doxycycline, which he will complete in 2 days. Examination today reveals an indurated lesion with central area of minimal fluctuance and surrounding ecchymosis, likely from manipulation. The lesion appears to be resolving. A photo was taken and added to the chart. - Wash the area with soap and water and Apply Vaseline to the lesion twice daily. - Discontinue Neosporin. - advised not to manipulate or squeeze the lesion. - If the lump persists after one month without pain, drainage, or increasing size, will order an ultrasound to evaluate for a persistent cyst. Discussed potential for removal  if it reforms.

## 2023-12-13 NOTE — Assessment & Plan Note (Signed)
 Blood pressure was elevated in the office today. Patient reports monitoring at home with recent readings around 130/80. He has an upcoming cardiology appointment on 12/17/2023. - Patient to bring a log of home blood pressure readings to the next appointment. - Follow-up in 4-6 weeks to recheck blood pressure and re-evaluate the back lesion. Will see after cardiology appointment to review their assessment.

## 2024-01-17 ENCOUNTER — Encounter: Payer: Self-pay | Admitting: Cardiology

## 2024-01-17 ENCOUNTER — Ambulatory Visit: Attending: Cardiology | Admitting: Cardiology

## 2024-01-17 VITALS — BP 135/78 | HR 65 | Ht 70.0 in | Wt 230.0 lb

## 2024-01-17 DIAGNOSIS — R0989 Other specified symptoms and signs involving the circulatory and respiratory systems: Secondary | ICD-10-CM | POA: Diagnosis not present

## 2024-01-17 DIAGNOSIS — I2510 Atherosclerotic heart disease of native coronary artery without angina pectoris: Secondary | ICD-10-CM | POA: Diagnosis not present

## 2024-01-17 DIAGNOSIS — I77819 Aortic ectasia, unspecified site: Secondary | ICD-10-CM

## 2024-01-17 DIAGNOSIS — I350 Nonrheumatic aortic (valve) stenosis: Secondary | ICD-10-CM | POA: Diagnosis not present

## 2024-01-17 NOTE — Patient Instructions (Signed)
 Medication Instructions:  The current medical regimen is effective;  continue present plan and medications.  *If you need a refill on your cardiac medications before your next appointment, please call your pharmacy*  Testing/Procedures: Your physician has requested that you have a carotid duplex. This test is an ultrasound of the carotid arteries in your neck. It looks at blood flow through these arteries that supply the brain with blood. Allow one hour for this exam. There are no restrictions or special instructions.  In September/October: Your physician has requested that you have an abdominal aorta duplex. During this test, an ultrasound is used to evaluate the aorta. Allow 30 minutes for this exam. Do not eat after midnight the day before and avoid carbonated beverages.  Please note: We ask at that you not bring children with you during ultrasound (echo/ vascular) testing. Due to room size and safety concerns, children are not allowed in the ultrasound rooms during exams. Our front office staff cannot provide observation of children in our lobby area while testing is being conducted. An adult accompanying a patient to their appointment will only be allowed in the ultrasound room at the discretion of the ultrasound technician under special circumstances. We apologize for any inconvenience.  Your physician has requested that you have an echocardiogram. Echocardiography is a painless test that uses sound waves to create images of your heart. It provides your doctor with information about the size and shape of your heart and how well your heart's chambers and valves are working. This procedure takes approximately one hour. There are no restrictions for this procedure. Please do NOT wear cologne, perfume, aftershave, or lotions (deodorant is allowed). Please arrive 15 minutes prior to your appointment time.  Please note: We ask at that you not bring children with you during ultrasound (echo/  vascular) testing. Due to room size and safety concerns, children are not allowed in the ultrasound rooms during exams. Our front office staff cannot provide observation of children in our lobby area while testing is being conducted. An adult accompanying a patient to their appointment will only be allowed in the ultrasound room at the discretion of the ultrasound technician under special circumstances. We apologize for any inconvenience.   Follow-Up: At [facility], you and your health needs are our priority.  As part of our continuing mission to provide you with exceptional heart care, our providers are all part of one team.  This team includes your primary Cardiologist (physician) and Advanced Practice Providers or APPs (Physician Assistants and Nurse Practitioners) who all work together to provide you with the care you need, when you need it.  Your next appointment:   1 year(s)  Provider:   Oneil Parchment, MD    We recommend signing up for the patient portal called MyChart.  Sign up information is provided on this After Visit Summary.  MyChart is used to connect with patients for Virtual Visits (Telemedicine).  Patients are able to view lab/test results, encounter notes, upcoming appointments, etc.  Non-urgent messages can be sent to your provider as well.   To learn more about what you can do with MyChart, go to [email or web address].

## 2024-01-17 NOTE — Progress Notes (Signed)
 Cardiology Office Note:  .   Date:  01/17/2024  ID:  Marcus Webb, DOB Aug 14, 1945, MRN [id number] PCP: Chandra Toribio POUR, MD  [facility] Providers Cardiologist:  Oneil Parchment, MD    History of Present Illness: .   Marcus Webb is a 78 y.o. male Discussed the use of AI scribe software for clinical note transcription with the patient, who gave verbal consent to proceed.  History of Present Illness Marcus Webb is a 77 year old male with moderate aortic stenosis and an abdominal aortic aneurysm who presents for a follow-up visit.  His blood pressure is generally stable at home, with a recent reading of 136/76 mmHg, although it tends to be high during doctor visits. He provided a log of his home blood pressure readings, which were mostly normal.  His abdominal aortic aneurysm was previously measured at 4.6 cm in maximal diameter. A vascular duplex on November 25, 2023, showed a maximal diameter of 4.4 cm in the mid abdominal aorta.  He has moderate aortic stenosis with a Vmax of 3.0 m/s and a mean gradient of 19 mmHg, as shown by an echocardiogram on November 25, 2023. The dimensionless index was 0.42. No major changes in symptoms such as chest pain, shortness of breath, or fainting.  He has hyperlipidemia, managed with Crestor  20 mg daily, with a previously recorded LDL of 31 mg/dL. His hypertension is managed with lisinopril  10 mg at bedtime, carvedilol  12.5 mg twice a day, and hydrochlorothiazide  25 mg once a day. He has been on these medications for years and uses a list to keep track of them.  He experienced a fall on Sunday, resulting in shoulder pain, which he believes is not serious. He has a history of coronary artery disease with an old stent and does not report new symptoms.  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 No chest pain, major shortness of breath, or fainting. Reports shoulder pain following a fall.      Studies Reviewed: SABRA   EKG  Interpretation Date/Time:  Tuesday January 17 2024 09:36:26 EST Ventricular Rate:  65 PR Interval:  252 QRS Duration:  106 QT Interval:  454 QTC Calculation: 472 R Axis:   28  Text Interpretation: Sinus rhythm with sinus arrhythmia with 1st degree A-V block When compared with ECG of 30-Nov-2022 13:23, No significant change was found Confirmed by Parchment Oneil ([id number]) on 01/17/2024 9:38:36 AM    Results RADIOLOGY Vascular duplex: Normal size of abdominal aorta, proximal abdominal aorta 3 cm, mid abdominal aorta 4.4 cm, distal abdominal aorta 2.3 cm (11/25/2023)  DIAGNOSTIC Echocardiogram: Normal ejection fraction 60%, grade 2 diastolic dysfunction, aortic valve moderate stenosis with Vmax 3.0 m/s, mean gradient 19 mmHg, dimensionless index 0.42 (11/25/2023) Risk Assessment/Calculations:            Physical Exam:   VS:  BP 135/78   Pulse 65   Ht 5' 10 (1.778 m)   Wt 230 lb (104.3 kg)   SpO2 95%   BMI 33.00 kg/m    Wt Readings from Last 3 Encounters:  01/17/24 230 lb (104.3 kg)  12/12/23 235 lb 6.4 oz (106.8 kg)  12/05/23 230 lb (104.3 kg)    GEN: Well nourished, well developed in no acute distress NECK: No JVD; + bilat carotid bruits  CARDIAC: RRR, 2/6 SM, no rubs, no gallops RESPIRATORY:  Clear to auscultation without rales, wheezing or rhonchi  ABDOMEN: Soft, non-tender, non-distended EXTREMITIES:  No edema; No deformity   ASSESSMENT  AND PLAN: .    Assessment and Plan Assessment & Plan Abdominal aortic aneurysm, stable, 4.4 cm The abdominal aortic aneurysm has a maximal diameter of 4.4 cm, showing a very small increase in size but remains stable. Intervention is not required unless it reaches 5 cm, at which point a stent may be considered. - Will repeat imaging next year to monitor aneurysm size.  Moderate aortic stenosis Vmax of 3.0 m/s and a mean gradient of 19 mmHg. The condition is not at the stage where valve replacement is necessary. Catheter-based replacement  is an option if needed in the future. - Continue monitoring aortic stenosis with echocardiogram in one year.  Carotid bruits We will check carotid Dopplers.  Could be radiation of aortic valve murmur.  Atherosclerotic heart disease, stable, with prior stent Atherosclerotic heart disease is well-managed with no chest pain, shortness of breath, or other unusual symptoms. The prior stent is functioning well. - Continue current management and monitoring.  Aspirin , statin  Hypertension, controlled on multidrug regimen Hypertension is well-controlled on a multidrug regimen including lisinopril  10 mg, carvedilol  12.5 mg twice daily, and hydrochlorothiazide  25 mg. Home blood pressure readings are satisfactory, and in-office readings can sometimes be elevated. - Continue current antihypertensive regimen. - Maintain home blood pressure log.  Hyperlipidemia, on statin therapy Hyperlipidemia is managed with Crestor  20 mg daily, with previously achieved LDL levels of 31 mg/dL. - Continue Crestor  20 mg daily.         Dispo: 1 yr  Signed, Oneil Parchment, MD

## 2024-01-23 ENCOUNTER — Ambulatory Visit: Payer: Self-pay | Admitting: Cardiology

## 2024-01-23 ENCOUNTER — Ambulatory Visit (HOSPITAL_COMMUNITY)
Admission: RE | Admit: 2024-01-23 | Discharge: 2024-01-23 | Disposition: A | Discharge status: Home / Self Care | Source: Ambulatory Visit | Attending: Cardiology | Admitting: Cardiology

## 2024-01-23 DIAGNOSIS — R0989 Other specified symptoms and signs involving the circulatory and respiratory systems: Secondary | ICD-10-CM | POA: Insufficient documentation

## 2024-01-24 ENCOUNTER — Ambulatory Visit: Admitting: Family Medicine

## 2024-01-24 ENCOUNTER — Encounter: Payer: Self-pay | Admitting: Family Medicine

## 2024-01-24 VITALS — BP 158/92 | HR 57 | Ht 70.0 in | Wt 235.0 lb

## 2024-01-24 DIAGNOSIS — M25512 Pain in left shoulder: Secondary | ICD-10-CM | POA: Insufficient documentation

## 2024-01-24 DIAGNOSIS — M75102 Unspecified rotator cuff tear or rupture of left shoulder, not specified as traumatic: Secondary | ICD-10-CM | POA: Insufficient documentation

## 2024-01-24 DIAGNOSIS — I10 Essential (primary) hypertension: Secondary | ICD-10-CM

## 2024-01-24 MED ORDER — OXYCODONE HCL 5 MG PO TABS: 2.5000 mg | ORAL_TABLET | Freq: Four times a day (QID) | ORAL | 0 refills | Status: DC | PRN

## 2024-01-24 MED ORDER — LISINOPRIL-HYDROCHLOROTHIAZIDE 20-25 MG PO TABS: 1.0000 | ORAL_TABLET | Freq: Every day | ORAL | 3 refills | Status: AC

## 2024-01-24 NOTE — Assessment & Plan Note (Signed)
 Fell approximately 10 days ago with subsequent left shoulder pain and inability to actively abduct the arm. Exam is concerning for a rotator cuff tear. - Referral to Orthopedics. - Order for left shoulder X-ray to be done at [facility] to rule out fracture prior to orthopedic appointment. - Ibuprofen use for pain. - For pain management: scheduled Tylenol 500mg  every 6 hours, topical Voltaren gel four times daily. - For breakthrough pain, prescribed oxycodone  5mg .

## 2024-01-24 NOTE — Progress Notes (Unsigned)
 Established Patient Office Visit  Subjective   Patient ID: Marcus Webb, male    DOB: November 29, 1945  Age: 78 y.o. MRN: [id number]  Chief Complaint  Patient presents with   Medical Management of Chronic Issues    HPI  Asa, carvedilol , hydrochlorothiazide , lisinopril , pantoprazole , crestor   Subjective - Follow-up for hypertension. Saw cardiology in October 2025, no changes were made to medications. Reports blood pressure has been higher since a fall approximately 10 days ago. Home BP log shows readings frequently in the 150s/90s, with some in the 130s-140s.  - Reports left shoulder injury after a fall approximately 10 days ago. Tripped and fell from a standing position. Initially had significant pain and difficulty with any movement. Pain has improved but reports inability to actively abduct the arm without assistance. Denies significant swelling or ecchymosis. Taking ibuprofen every 4-8 hours for pain. Reports Tylenol is less effective.  - Follow-up for skin lesion on back. Reports it is better. Denies itching, oozing, or pain. Notes a small residual knot.  Medications aspirin , carvedilol , hydrochlorothiazide  12.5mg , lisinopril  10mg , pantoprazole , Crestor .  PMH, PSH, FH, Social Hx PMHx: Hypertension, hyperlipidemia, history of shoulder tendinitis and bursitis.  ROS MSK: Positive for left shoulder pain and decreased active range of motion. Negative for significant swelling or bruising. Skin: Negative for pruritus, exudate, or pain at the site of the back lesion.   The ASCVD Risk score (Arnett DK, et al., 2019) failed to calculate for the following reasons:   The valid total cholesterol range is 130 to 320 mg/dL  Health Maintenance Due  Topic Date Due   COVID-19 Vaccine (1) Never done   Hepatitis C Screening  Never done   Pneumococcal Vaccine: 50+ Years (1 of 2 - PCV) 04/24/1966   Zoster Vaccines- Shingrix (1 of 2) 04/24/1966   Influenza Vaccine  09/30/2023      Objective:      BP (!) 158/92   Pulse (!) 57   Ht 5' 10 (1.778 m)   Wt 235 lb (106.6 kg)   SpO2 96%   BMI 33.72 kg/m  {Vitals History (Optional):23777}  Physical Exam Gen: alert, oriented SKIN: Back lesion is healed over. Small subcutaneous nodule noted. No erythema or signs of infection. A red spot is noted on the right foot, attributed to footwear. MSK: Left shoulder exam: No tenderness to palpation. Active abduction is limited. Passive range of motion is full and non-painful. Positive drop arm test. Unable to hold arm in abducted position after passive elevation.   No results found for any visits on 01/24/24.      Assessment & Plan:   Acute pain of left shoulder Assessment & Plan: Clemens approximately 10 days ago with subsequent left shoulder pain and inability to actively abduct the arm. Exam is concerning for a rotator cuff tear. - Referral to Orthopedics. - Order for left shoulder X-ray to be done at [facility] to rule out fracture prior to orthopedic appointment. - Ibuprofen use for pain. - For pain management: scheduled Tylenol 500mg  every 6 hours, topical Voltaren gel four times daily. - For breakthrough pain, prescribed oxycodone  5mg .  Orders: -     Ambulatory referral to Orthopedic Surgery  Benign essential hypertension Assessment & Plan: Seen by cardiology in October 2025 with no medication changes. Home BP readings have been elevated, frequently >140/90 mmHg. Current medications include lisinopril  10mg  and hydrochlorothiazide  12.5mg . - Discontinue lisinopril  10mg  and hydrochlorothiazide  12.5mg . - Start lisinopril -hydrochlorothiazide  20-12.5mg  combination pill, one tablet daily. Prescription sent to [facility] on  [address]. - Continue to monitor BP at home and follow up in 2 weeks with log.   Other orders -     Lisinopril -hydroCHLOROthiazide ; Take 1 tablet by mouth daily.  Dispense: 90 tablet; Refill: 3 -     oxyCODONE  HCl; Take 0.5-1 tablets (2.5-5 mg total) by mouth  every 6 (six) hours as needed for severe pain (pain score 7-10) or breakthrough pain.  Dispense: 20 tablet; Refill: 0     Return in about 6 weeks (around 03/06/2024) for HTN.    Toribio MARLA Slain, MD

## 2024-01-24 NOTE — Assessment & Plan Note (Signed)
 Seen by cardiology in October 2025 with no medication changes. Home BP readings have been elevated, frequently >140/90 mmHg. Current medications include lisinopril  10mg  and hydrochlorothiazide  12.5mg . - Discontinue lisinopril  10mg  and hydrochlorothiazide  12.5mg . - Start lisinopril -hydrochlorothiazide  20-12.5mg  combination pill, one tablet daily. Prescription sent to [facility] on [address]. - Continue to monitor BP at home and follow up in 2 weeks with log.

## 2024-01-24 NOTE — Patient Instructions (Addendum)
 It was nice to see you today,  We addressed the following topics today: - You will stop taking your separate lisinopril  and hydrochlorothiazide  pills. - I am sending a new prescription for a combination pill, lisinopril -hydrochlorothiazide  20mg -25mg , to the [facility] on [address]. You will take one of these pills each day.These will replace your lisinopril  and hydrochlorothiazide  that you are currently taking.  - Please continue checking your blood pressure at home and bring your log to your follow-up appointment in a couple of weeks. Our goal is to have most readings in the 120-140 range. - I am referring you to an orthopedist for your shoulder. Someone should call you to schedule this in the next 1-2 weeks.  - I am also ordering an X-ray of your left shoulder. You can go to [facility] on [address] for this; it is a walk-in, so no appointment is needed. If the orthopedist can see you soon, they may just do the X-ray in their office, and you can skip going to [facility]. - For shoulder pain, I would like you to take Tylenol 500mg  every six hours. - You can also apply Voltaren gel, which is over-the-counter, to your shoulder up to four times a day. - For severe pain that is not controlled by the Tylenol, I have prescribed oxycodone  5mg . You can start with half a tablet to see if that is effective. If not, you can take a whole tablet. - Please do not scratch or squeeze the spot on your back. The skin has healed, so you do not need to apply Vaseline anymore.  Have a great day,  Rolan Slain, MD

## 2024-01-30 ENCOUNTER — Ambulatory Visit (INDEPENDENT_AMBULATORY_CARE_PROVIDER_SITE_OTHER)

## 2024-01-30 ENCOUNTER — Ambulatory Visit (INDEPENDENT_AMBULATORY_CARE_PROVIDER_SITE_OTHER): Admitting: Student

## 2024-01-30 DIAGNOSIS — M25512 Pain in left shoulder: Secondary | ICD-10-CM | POA: Diagnosis not present

## 2024-01-30 DIAGNOSIS — M19012 Primary osteoarthritis, left shoulder: Secondary | ICD-10-CM | POA: Diagnosis not present

## 2024-01-30 NOTE — Progress Notes (Signed)
 Chief Complaint: Left shoulder pain    Discussed the use of AI scribe software for clinical note transcription with the patient, who gave verbal consent to proceed.  History of Present Illness Marcus Webb is a 78 year old male who presents with left shoulder pain following a fall.  He fell on January 15, 2024 after tripping over a pair of pants he was caring and landing on stacked water bottles. Left shoulder pain began immediately and has somewhat improved over time. Pain is in the anterior and lateral shoulder and radiates down the arm. He noted no significant swelling or bruising aside from a small area of swelling that appeared later. He uses Advil and Voltaren cream for pain. He is right-handed and has had no prior problems with the left shoulder. He previously had tendinitis, bursitis, and a muscle strain in the right shoulder.   Surgical History:   None  PMH/PSH/Family History/Social History/Meds/Allergies:   No past medical history on file. No past surgical history on file. Social History   Socioeconomic History   Marital status: Married    Spouse name: Not on file   Number of children: Not on file   Years of education: Not on file   Highest education level: Not on file  Occupational History   Not on file  Tobacco Use   Smoking status: Former    Types: Cigarettes    Passive exposure: Never   Smokeless tobacco: Never  Vaping Use   Vaping status: Never Used  Substance and Sexual Activity   Alcohol use: Not Currently   Drug use: Never   Sexual activity: Not Currently  Other Topics Concern   Not on file  Social History Narrative   Not on file   Social Drivers of Health   Financial Resource Strain: Low Risk  (05/05/2023)   Overall Financial Resource Strain (CARDIA)    Difficulty of Paying Living Expenses: Not hard at all  Food Insecurity: No Food Insecurity (05/05/2023)   Hunger Vital Sign    Worried About Running Out of Food in  the Last Year: Never true    Ran Out of Food in the Last Year: Never true  Transportation Needs: No Transportation Needs (05/05/2023)   PRAPARE - Administrator, Civil Service (Medical): No    Lack of Transportation (Non-Medical): No  Physical Activity: Insufficiently Active (05/05/2023)   Exercise Vital Sign    Days of Exercise per Week: 4 days    Minutes of Exercise per Session: 30 min  Stress: No Stress Concern Present (05/05/2023)   [employer] of Occupational Health - Occupational Stress Questionnaire    Feeling of Stress : Not at all  Social Connections: Moderately Integrated (05/05/2023)   Social Connection and Isolation Panel    Frequency of Communication with Friends and Family: More than three times a week    Frequency of Social Gatherings with Friends and Family: More than three times a week    Attends Religious Services: More than 4 times per year    Active Member of [employer] or Organizations: No    Attends Banker Meetings: Never    Marital Status: Married   No family history on file. No Known Allergies Current Outpatient Medications  Medication Sig Dispense Refill   aspirin  EC 81 MG tablet Take 1  tablet (81 mg total) by mouth daily. Swallow whole. 90 tablet 3   carvedilol  (COREG ) 12.5 MG tablet Take 1 tablet (12.5 mg total) by mouth 2 (two) times daily. 180 tablet 0   lisinopril -hydrochlorothiazide  (ZESTORETIC ) 20-25 MG tablet Take 1 tablet by mouth daily. 90 tablet 3   oxyCODONE  (OXY IR/ROXICODONE ) 5 MG immediate release tablet Take 0.5-1 tablets (2.5-5 mg total) by mouth every 6 (six) hours as needed for severe pain (pain score 7-10) or breakthrough pain. 20 tablet 0   pantoprazole  (PROTONIX ) 40 MG tablet Take 1 tablet (40 mg total) by mouth 2 (two) times daily. 180 tablet 3   rosuvastatin  (CRESTOR ) 20 MG tablet Take 1 tablet (20 mg total) by mouth daily. 90 tablet 0   No current facility-administered medications for this visit.   DG Shoulder  Left Result Date: 01/30/2024 CLINICAL DATA:  left shoulder pain EXAM: LEFT SHOULDER - 2+ VIEW COMPARISON:  None Available. FINDINGS: No acute fracture or dislocation. 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 Soft tissues are unremarkable. IMPRESSION: 1. No acute fracture or dislocation. 2. 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 Electronically Signed   By: Rogelia Myers M.D.   On: 01/30/2024 17:35    Review of Systems:   A ROS was performed including pertinent positives and negatives as documented in the HPI.  Physical Exam :   Constitutional: NAD and appears stated age Neurological: Alert and oriented Psych: Appropriate affect and cooperative There were no vitals taken for this visit.   Comprehensive Musculoskeletal Exam:    Exam of the left shoulder demonstrates tenderness over the lateral deltoid.  Active range of motion limited to 70 degrees flexion and abduction.  Full passive flexion to 160 degrees.  Active external rotation to 40 degrees and internal rotation to L5.  Positive drop arm test.  Imaging:   Xray (left shoulder 3 views): Minimal glenohumeral and mild AC joint osteoarthritis.  Negative for acute fracture or dislocation.   I personally reviewed and interpreted the radiographs.      Assessment & Plan Left shoulder pain and limited range of motion following fall, suspected rotator cuff tear   Pain and limited range of motion with positive drop arm test suggests a rotator cuff tear. X-rays are normal, but an MRI is needed to evaluate the injury's extent.  A sling was provided for comfort however patient has been using the shoulder for low-level activities without too much discomfort.  Will schedule follow-up shortly after MRI to review and discuss treatment options.       I personally saw and evaluated the patient, and participated in the management and treatment plan.  Leonce Reveal, PA-C Orthopedics

## 2024-02-06 ENCOUNTER — Inpatient Hospital Stay: Admission: RE | Admit: 2024-02-06 | Discharge: 2024-02-06 | Attending: Student

## 2024-02-06 DIAGNOSIS — M25512 Pain in left shoulder: Secondary | ICD-10-CM

## 2024-02-06 DIAGNOSIS — S46012A Strain of muscle(s) and tendon(s) of the rotator cuff of left shoulder, initial encounter: Secondary | ICD-10-CM | POA: Diagnosis not present

## 2024-02-10 ENCOUNTER — Ambulatory Visit (HOSPITAL_BASED_OUTPATIENT_CLINIC_OR_DEPARTMENT_OTHER): Admitting: Student

## 2024-02-10 DIAGNOSIS — S46012A Strain of muscle(s) and tendon(s) of the rotator cuff of left shoulder, initial encounter: Secondary | ICD-10-CM | POA: Diagnosis not present

## 2024-02-10 NOTE — Progress Notes (Signed)
 Chief Complaint: Left shoulder pain     History of Present Illness  02/10/24: Patient presents today for MRI review of the left shoulder.  He states that overall his range of motion continues to be limited however pain has improved very slightly.   01/30/24: Marcus Webb is a 78 year old male who presents with left shoulder pain following a fall. He fell on January 15, 2024 after tripping over a pair of pants he was caring and landing on stacked water bottles. Left shoulder pain began immediately and has somewhat improved over time. Pain is in the anterior and lateral shoulder and radiates down the arm. He noted no significant swelling or bruising aside from a small area of swelling that appeared later. He uses Advil and Voltaren cream for pain. He is right-handed and has had no prior problems with the left shoulder. He previously had tendinitis, bursitis, and a muscle strain in the right shoulder.   Surgical History:   None  PMH/PSH/Family History/Social History/Meds/Allergies:   No past medical history on file. No past surgical history on file. Social History   Socioeconomic History   Marital status: Married    Spouse name: Not on file   Number of children: Not on file   Years of education: Not on file   Highest education level: Not on file  Occupational History   Not on file  Tobacco Use   Smoking status: Former    Types: Cigarettes    Passive exposure: Never   Smokeless tobacco: Never  Vaping Use   Vaping status: Never Used  Substance and Sexual Activity   Alcohol use: Not Currently   Drug use: Never   Sexual activity: Not Currently  Other Topics Concern   Not on file  Social History Narrative   Not on file   Social Drivers of Health   Tobacco Use: Medium Risk (01/24/2024)   Patient History    Smoking Tobacco Use: Former    Smokeless Tobacco Use: Never    Passive Exposure: Never  Physicist, Medical Strain: Low Risk (05/05/2023)    Overall Financial Resource Strain (CARDIA)    Difficulty of Paying Living Expenses: Not hard at all  Food Insecurity: No Food Insecurity (05/05/2023)   Hunger Vital Sign    Worried About Running Out of Food in the Last Year: Never true    Ran Out of Food in the Last Year: Never true  Transportation Needs: No Transportation Needs (05/05/2023)   PRAPARE - Administrator, Civil Service (Medical): No    Lack of Transportation (Non-Medical): No  Physical Activity: Insufficiently Active (05/05/2023)   Exercise Vital Sign    Days of Exercise per Week: 4 days    Minutes of Exercise per Session: 30 min  Stress: No Stress Concern Present (05/05/2023)   [employer] of Occupational Health - Occupational Stress Questionnaire    Feeling of Stress : Not at all  Social Connections: Moderately Integrated (05/05/2023)   Social Connection and Isolation Panel    Frequency of Communication with Friends and Family: More than three times a week    Frequency of Social Gatherings with Friends and Family: More than three times a week    Attends Religious Services: More than 4 times per year    Active Member of Clubs or Organizations: No  Attends Banker Meetings: Never    Marital Status: Married  Depression (PHQ2-9): Low Risk (09/07/2023)   Depression (PHQ2-9)    PHQ-2 Score: 0  Alcohol Screen: Low Risk (05/05/2023)   Alcohol Screen    Last Alcohol Screening Score (AUDIT): 0  Housing: Unknown (05/05/2023)   Housing Stability Vital Sign    Unable to Pay for Housing in the Last Year: No    Number of Times Moved in the Last Year: Not on file    Homeless in the Last Year: No  Utilities: Not At Risk (05/05/2023)   AHC Utilities    Threatened with loss of utilities: No  Health Literacy: Adequate Health Literacy (05/05/2023)   B1300 Health Literacy    Frequency of need for help with medical instructions: Never   No family history on file. No Known Allergies Current Outpatient Medications   Medication Sig Dispense Refill   aspirin  EC 81 MG tablet Take 1 tablet (81 mg total) by mouth daily. Swallow whole. 90 tablet 3   carvedilol  (COREG ) 12.5 MG tablet Take 1 tablet (12.5 mg total) by mouth 2 (two) times daily. 180 tablet 0   lisinopril -hydrochlorothiazide  (ZESTORETIC ) 20-25 MG tablet Take 1 tablet by mouth daily. 90 tablet 3   oxyCODONE  (OXY IR/ROXICODONE ) 5 MG immediate release tablet Take 0.5-1 tablets (2.5-5 mg total) by mouth every 6 (six) hours as needed for severe pain (pain score 7-10) or breakthrough pain. 20 tablet 0   pantoprazole  (PROTONIX ) 40 MG tablet Take 1 tablet (40 mg total) by mouth 2 (two) times daily. 180 tablet 3   rosuvastatin  (CRESTOR ) 20 MG tablet Take 1 tablet (20 mg total) by mouth daily. 90 tablet 0   No current facility-administered medications for this visit.   No results found.   Review of Systems:   A ROS was performed including pertinent positives and negatives as documented in the HPI.  Physical Exam :   Constitutional: NAD and appears stated age Neurological: Alert and oriented Psych: Appropriate affect and cooperative There were no vitals taken for this visit.   Comprehensive Musculoskeletal Exam:    Exam of the left shoulder demonstrates tenderness over the lateral deltoid.  Active range of motion limited to 70 degrees flexion and abduction.  Full passive flexion to 160 degrees.  Active external rotation to 40 degrees and internal rotation to L5.  Positive drop arm test.  Imaging:   MRI left shoulder: Full-thickness and retracted tear of the supraspinatus and almost complete tear of the infraspinatus   I personally reviewed and interpreted the radiographs.      Assessment & Plan Left full-thickness rotator cuff tear Review of left shoulder MRI today confirms presence of a full-thickness supraspinatus tear which does demonstrate retraction.  There is also a significant tear in the infraspinatus which is near complete.  Discussed  these findings with patient as well as expected outcomes with range of motion and degenerative changes in the setting of nonoperative management.  Patient would like to pursue surgical options in order to retain function as are important with his activities of daily living and hobbies including target shooting.  Patient reports that multiple friends and relatives have highly recommended Dr. Dozier and he would like to follow-up with them to discuss surgical options.  Discussed that this is very reasonable and he can let us  know if there is anything else he needs moving forward.     I personally saw and evaluated the patient, and participated in the management and  treatment plan.  Leonce Reveal, PA-C Orthopedics

## 2024-02-13 ENCOUNTER — Telehealth: Payer: Self-pay

## 2024-02-13 ENCOUNTER — Other Ambulatory Visit: Payer: Self-pay | Admitting: Orthopedic Surgery

## 2024-02-13 NOTE — Telephone Encounter (Signed)
 Surgical clearance received for Left reverse total shoulder arthroplasty. Placed in providers box.   Does pt need an appt prior to this clearance being complete.  DOS is 03/29/24

## 2024-02-14 ENCOUNTER — Telehealth (HOSPITAL_BASED_OUTPATIENT_CLINIC_OR_DEPARTMENT_OTHER): Payer: Self-pay

## 2024-02-14 NOTE — Telephone Encounter (Signed)
 Clearance filled out and placed in cma box.  Please include my most recent note and cardiology's note from 11/18

## 2024-02-14 NOTE — Telephone Encounter (Signed)
 Faxed 02/14/2024

## 2024-02-14 NOTE — Telephone Encounter (Signed)
° °  Pre-operative Risk Assessment    Patient Name: Marcus Webb.  DOB: 11-26-1945 MRN: [id number]   Date of last office visit: 01/17/2024 With Dr. Jeffrie Date of next office visit: None  Request for Surgical Clearance    Procedure:  Left reverse total shoulder arthroplasty  Date of Surgery:  Clearance 03/29/24                                 Surgeon:  Eva Herring Surgeon's Group or Practice Name:  [facility] Phone number:  [phone] Fax number:  [phone]   Type of Clearance Requested:   - Medical  - Pharmacy:  Hold Aspirin  -does not specify   Type of Anesthesia:  Choice   Additional requests/questions:  None  SignedPatrcia Iverson LITTIE   02/14/2024, 8:46 AM

## 2024-02-16 NOTE — Telephone Encounter (Signed)
° °  Name: Marcus Webb.  DOB: 06/15/1945  MRN: [id number]   Primary Cardiologist: Oneil Parchment, MD  Chart reviewed as part of pre-operative protocol coverage. Ryan LITTIE Drena Mickey. was last seen on 01/17/2024 by Dr. Parchment.  Per Dr. Parchment He may proceed with shoulder surgery with moderate overall cardiac risk. He was able to complete greater than 4 METS of activity prior to his injury. He has moderate aortic stenosis.  Therefore, based on ACC/AHA guidelines, the patient would be an acceptable risk for the planned procedure without further cardiovascular testing.   Ideally aspirin  should be continued without interruption, however if the bleeding risk is too great, aspirin  may be held for 5-7 days prior to surgery. Please resume aspirin  post operatively when it is felt to be safe from a bleeding standpoint.    I will route this recommendation to the requesting party via Epic fax function and remove from pre-op pool. Please call with questions.  Barnie Hila, NP 02/16/2024, 1:04 PM

## 2024-03-01 ENCOUNTER — Other Ambulatory Visit: Payer: Self-pay | Admitting: Cardiology

## 2024-03-01 DIAGNOSIS — I10 Essential (primary) hypertension: Secondary | ICD-10-CM

## 2024-03-01 DIAGNOSIS — I714 Abdominal aortic aneurysm, without rupture, unspecified: Secondary | ICD-10-CM

## 2024-03-01 DIAGNOSIS — E785 Hyperlipidemia, unspecified: Secondary | ICD-10-CM

## 2024-03-01 DIAGNOSIS — Z79899 Other long term (current) drug therapy: Secondary | ICD-10-CM

## 2024-03-01 DIAGNOSIS — R011 Cardiac murmur, unspecified: Secondary | ICD-10-CM

## 2024-03-06 ENCOUNTER — Encounter: Payer: Self-pay | Admitting: Family Medicine

## 2024-03-06 ENCOUNTER — Ambulatory Visit: Admitting: Family Medicine

## 2024-03-06 VITALS — BP 136/86 | HR 69 | Ht 70.0 in | Wt 235.4 lb

## 2024-03-06 DIAGNOSIS — S46012D Strain of muscle(s) and tendon(s) of the rotator cuff of left shoulder, subsequent encounter: Secondary | ICD-10-CM

## 2024-03-06 DIAGNOSIS — I10 Essential (primary) hypertension: Secondary | ICD-10-CM

## 2024-03-06 NOTE — Patient Instructions (Signed)
 It was nice to see you today,  We addressed the following topics today: - continue taking your current blood pressure medication - talk to your orthopedist about any medications you might need to stop prior to surgery.  The only one they may stop might be the baby aspirin .   Have a great day,  Rolan Slain, MD

## 2024-03-06 NOTE — Progress Notes (Unsigned)
" ° °  Established Patient Office Visit  Subjective   Patient ID: Marcus Webb., male    DOB: [birth year]  Age: 79 y.o. MRN: [id number]  Chief Complaint  Patient presents with   Medical Management of Chronic Issues    Discussed the use of AI scribe software for clinical note transcription with the patient, who gave verbal consent to proceed.  History of Present Illness   Marcus Webb. is a 79 year old male with hypertension who presents for blood pressure management and pre-operative evaluation for shoulder surgery.  His blood pressure has been generally well-controlled with the new medication regimen, which combines his previous medications into one. He notes that his blood pressure has been lower than it has ever been, with occasional readings as low as 100/60 mmHg. However, he has experienced a few instances of elevated blood pressure in the last few days, reaching the 140s, whereas previously it was in the 150s or 160s. He feels weak when his blood pressure is low.  He is scheduled for shoulder surgery on January 29th due to two complete tears. He recalls a previous shoulder injury in the 1980s, which was diagnosed as tendinitis, bursitis, and a full muscle tear, and took six months to recover without therapy. He mentions taking low iron and fish oil supplements.          The ASCVD Risk score (Arnett DK, et al., 2019) failed to calculate for the following reasons:   The valid total cholesterol range is 130 to 320 mg/dL  Health Maintenance Due  Topic Date Due   COVID-19 Vaccine (1) Never done   Hepatitis C Screening  Never done   Medicare Annual Wellness (AWV)  05/04/2024      Objective:     BP 136/86   Pulse 69   Ht 5' 10 (1.778 m)   Wt 235 lb 6.4 oz (106.8 kg)   SpO2 95%   BMI 33.78 kg/m  {Vitals History (Optional):23777}  Physical Exam   VITALS: BP- 140/70     Gen: alert, oriented Pulm: no respiratory distress Psych: pleasant affect    No results found  for any visits on 03/06/24.      Assessment & Plan:   Benign essential hypertension Assessment & Plan: Blood pressure readings fluctuate, with some as low as 106/60 mmHg and occasional highs in the 140s. Current medication effective, previously in the 150s-160s. No changes necessary. - Continue current antihypertensive medication regimen.    Traumatic complete tear of left rotator cuff, subsequent encounter Assessment & Plan: Scheduled for rotator cuff repair on January 29th due to two complete tears. MRI confirmed diagnosis. Surgery advised for better long-term function. Explained risks of short-term discomfort during rehabilitation. Surgery preferred over reattachment due to potential need for another operation and prolonged rehabilitation. - Proceed with scheduled rotator cuff repair on January 29th. - will likely need to stop baby aspirin  one week prior to surgery.                 No follow-ups on file.    Toribio MARLA Slain, MD  "

## 2024-03-06 NOTE — Assessment & Plan Note (Signed)
 Blood pressure readings fluctuate, with some as low as 106/60 mmHg and occasional highs in the 140s. Current medication effective, previously in the 150s-160s. No changes necessary. - Continue current antihypertensive medication regimen.

## 2024-03-06 NOTE — Assessment & Plan Note (Signed)
 Scheduled for rotator cuff repair on January 29th due to two complete tears. MRI confirmed diagnosis. Surgery advised for better long-term function. Explained risks of short-term discomfort during rehabilitation. Surgery preferred over reattachment due to potential need for another operation and prolonged rehabilitation. - Proceed with scheduled rotator cuff repair on January 29th. - will likely need to stop baby aspirin  one week prior to surgery.

## 2024-03-08 NOTE — Care Plan (Signed)
 Ortho Bundle Case Management Note  Patient Details  Name: Marcus Webb. MRN: [id number] Date of Birth: 1945-11-05  Patient will discharge to home with family to assist. No DME needed. OPPT set up with [facility]. Patient and MD in agreement with plan. Choice offered.                     DME Arranged:    DME Agency:     HH Arranged:    HH Agency:     Additional Comments: Please contact me with any questions of if this plan should need to change.  Charlies Pitch,  RN,BSN,MHA,CCM  [facility] Orthopaedic Specialist  [phone] 03/08/2024, 10:15 AM

## 2024-03-20 NOTE — Progress Notes (Signed)
 COVID Vaccine received:  []  No [x]  Yes Date of any COVID positive Test in last 90 days:  None  PCP - Toribio Slain, MD clearance in 03-06-2024 Epic note Cardiologist - Oneil Parchment, MD Barnie Hila, NP cardiac clearance in 02-16-2024 Epic note   Chest x-ray -  EKG -  01-17-2024  Epic Stress Test -  ECHO - 11-25-2023  Epic Cardiac Cath -  CT Coronary Calcium  score:   Pacemaker / ICD device [x]  No []  Yes   Spinal Cord Stimulator:[x]  No []  Yes       History of Sleep Apnea? [x]  No []  Yes   CPAP used?- [x]  No []  Yes    Medication on DOS: Pantoprazole  (PROTONIX ), carvedilol  (COREG ), Tylenol OR oxycodone  (OXY IR) prn   Hold DOS: lisinopril - HCTZ (ZESTORETIC ),   Patient has: []  NO Hx DM   [x]  Pre-DM   []  DM1  []   DM2 Does the patient monitor blood sugar?   []  N/A   [x]  No []  Yes  Last A1c was:  5.7 on   08-29-2023  no meds  Blood Thinner / Instructions: Aspirin  Instructions: 81 mg  Hold 5-7 days  Activity level: Able to walk up 2 flights of stairs without becoming significantly short of breath or having chest pain?   []    Yes   [x]  No,  would have:  Patient can perform ADLs without assistance.  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  Patient verbalized understanding and agreement to the Pre-Surgical Instructions that were given to them at this PAT appointment. Patient was also educated of the need to review these PAT instructions again prior to his surgery.I reviewed the appropriate phone numbers to call if they have any and questions or concerns.

## 2024-03-21 ENCOUNTER — Ambulatory Visit (HOSPITAL_COMMUNITY)
Admission: RE | Admit: 2024-03-21 | Discharge: 2024-03-21 | Disposition: A | Discharge status: Home / Self Care | Source: Ambulatory Visit | Attending: Orthopedic Surgery | Admitting: Orthopedic Surgery

## 2024-03-21 ENCOUNTER — Encounter (HOSPITAL_COMMUNITY): Payer: Self-pay

## 2024-03-21 ENCOUNTER — Encounter (HOSPITAL_COMMUNITY)
Admission: RE | Admit: 2024-03-21 | Discharge: 2024-03-21 | Disposition: A | Source: Ambulatory Visit | Attending: Orthopedic Surgery

## 2024-03-21 ENCOUNTER — Other Ambulatory Visit: Payer: Self-pay

## 2024-03-21 VITALS — BP 172/82 | HR 58 | Temp 98.6°F | Resp 20 | Ht 70.0 in | Wt 230.0 lb

## 2024-03-21 DIAGNOSIS — Z01818 Encounter for other preprocedural examination: Secondary | ICD-10-CM | POA: Diagnosis present

## 2024-03-21 DIAGNOSIS — S46012D Strain of muscle(s) and tendon(s) of the rotator cuff of left shoulder, subsequent encounter: Secondary | ICD-10-CM | POA: Insufficient documentation

## 2024-03-21 DIAGNOSIS — I10 Essential (primary) hypertension: Secondary | ICD-10-CM | POA: Insufficient documentation

## 2024-03-21 HISTORY — DX: Unspecified osteoarthritis, unspecified site: M19.90

## 2024-03-21 HISTORY — DX: Pneumonia, unspecified organism: J18.9

## 2024-03-21 HISTORY — DX: Prediabetes: R73.03

## 2024-03-21 HISTORY — DX: Essential (primary) hypertension: I10

## 2024-03-21 HISTORY — DX: Abdominal aortic aneurysm, without rupture, unspecified: I71.40

## 2024-03-21 HISTORY — DX: Gastro-esophageal reflux disease without esophagitis: K21.9

## 2024-03-21 HISTORY — DX: Nonrheumatic aortic (valve) stenosis: I35.0

## 2024-03-21 HISTORY — DX: Cardiac murmur, unspecified: R01.1

## 2024-03-21 HISTORY — DX: Atherosclerotic heart disease of native coronary artery without angina pectoris: I25.10

## 2024-03-21 HISTORY — DX: Anemia, unspecified: D64.9

## 2024-03-21 LAB — CBC
HCT: 39.5 % (ref 39.0–52.0)
Hemoglobin: 13.4 g/dL (ref 13.0–17.0)
MCH: 32.2 pg (ref 26.0–34.0)
MCHC: 33.9 g/dL (ref 30.0–36.0)
MCV: 95 fL (ref 80.0–100.0)
Platelets: 180 K/uL (ref 150–400)
RBC: 4.16 MIL/uL — ABNORMAL LOW (ref 4.22–5.81)
RDW: 14.6 % (ref 11.5–15.5)
WBC: 5.4 K/uL (ref 4.0–10.5)
nRBC: 0 % (ref 0.0–0.2)

## 2024-03-21 LAB — BASIC METABOLIC PANEL WITH GFR
Anion gap: 12 (ref 5–15)
BUN: 16 mg/dL (ref 8–23)
CO2: 30 mmol/L (ref 22–32)
Calcium: 9.5 mg/dL (ref 8.9–10.3)
Chloride: 103 mmol/L (ref 98–111)
Creatinine, Ser: 0.77 mg/dL (ref 0.61–1.24)
GFR, Estimated: >60 mL/min
Glucose, Bld: 79 mg/dL (ref 70–99)
Potassium: 3.9 mmol/L (ref 3.5–5.1)
Sodium: 144 mmol/L (ref 135–145)

## 2024-03-21 LAB — SURGICAL PCR SCREEN
MRSA, PCR: NEGATIVE
Staphylococcus aureus: NEGATIVE

## 2024-03-21 NOTE — Patient Instructions (Signed)
 SURGICAL WAITING ROOM VISITATION Patients having surgery or a procedure may have no more than 2 support people in the waiting area - these visitors may rotate in the visitor waiting room.   If the patient needs to stay at the hospital during part of their recovery, the visitor guidelines for inpatient rooms apply.  PRE-OP VISITATION  Pre-op nurse will coordinate an appropriate time for 1 support person to accompany the patient in pre-op.  This support person may not rotate.  This visitor will be contacted when the time is appropriate for the visitor to come back in the pre-op area.  Please refer to the [facility] website for the visitor guidelines for Inpatients (after your surgery is over and you are in a regular room).  Temporary Visitor Restrictions  Children ages 29 and under will not be able to visit patients in [facility] under most circumstances. Visitation is not restricted outside of hospitals unless noted otherwise in the [facility] and Location Specific Visitation Guidelines at :      [email or web address] Visitors with respiratory illnesses are discouraged from visiting and should remain at home.  You are not required to quarantine at this time prior to your surgery. However, you must do this: Hand Hygiene often Do NOT share personal items Notify your provider if you are in close contact with someone who has COVID or you develop fever 100.4 or greater, new onset of sneezing, cough, sore throat, shortness of breath or body aches.  If you test positive for Covid or have been in contact with anyone that has tested positive in the last 10 days please notify you surgeon.    Your procedure is scheduled on:  THURSDAY  03-29-2024  Report to [facility] Main Entrance: [address] entrance where the [employer] is available.   Report to admitting at: 07:00    AM  Call this number if you have any questions or problems the morning of surgery [phone]  Do not eat  food after Midnight the night prior to your surgery/procedure.  After Midnight you may have the following liquids until   06:30  AM DAY OF SURGERY  Clear Liquid Diet Water Black Coffee (sugar ok, NO MILK/CREAM OR CREAMERS)  Tea (sugar ok, NO MILK/CREAM OR CREAMERS) regular and decaf                             Plain Jell-O  with no fruit (NO RED)                                           Fruit ices (not with fruit pulp, NO RED)                                     Popsicles (NO RED)                                                                  Juice: NO CITRUS JUICES: only apple, WHITE grape, WHITE cranberry Sports drinks like Gatorade or Powerade (NO RED)  FOLLOW ANY ADDITIONAL PRE OP INSTRUCTIONS YOU RECEIVED FROM YOUR SURGEON'S OFFICE!!!   Oral Hygiene is also important to reduce your risk of infection.        Remember - BRUSH YOUR TEETH THE MORNING OF SURGERY WITH YOUR REGULAR TOOTHPASTE  Do NOT smoke after Midnight the night before surgery.  STOP TAKING all Vitamins, Herbs and supplements 1 week before your surgery.   Take ONLY these medicines the morning of surgery with A SIP OF WATER: Pantoprazole  (PROTONIX ), carvedilol  (COREG ), You may take EITHER Tylenol OR oxycodone  (OXY IR) if needed for pain.   DO NOT TAKE  lisinopril - HCTZ (ZESTORETIC ), the morning of your surgery.                    You may not have any metal on your body including  jewelry, and body piercing  Do not wear  lotions, powders, cologne, or deodorant  Men may shave face and neck.  Contacts, Hearing Aids, dentures or bridgework may not be worn into surgery. DENTURES WILL BE REMOVED PRIOR TO SURGERY PLEASE DO NOT APPLY Poly grip OR ADHESIVES!!!  Patients discharged on the day of surgery will not be allowed to drive home.  Someone NEEDS to stay with you for the first 24 hours after anesthesia.  Do not bring your home medications to the hospital. The Pharmacy will dispense medications  listed on your medication list to you during your admission in the Hospital.  Special Instructions: Bring a copy of your healthcare power of attorney and living will documents the day of surgery, if you wish to have them scanned into your [facility] Records- EPIC  Please read over the following fact sheets you were given: IF YOU HAVE QUESTIONS ABOUT YOUR PRE-OP INSTRUCTIONS, PLEASE CALL [phone].      Pre-operative 4 CHG Bath Instructions   You can play a key role in reducing the risk of infection after surgery. Your skin needs to be as free of germs as possible. You can reduce the number of germs on your skin by washing with CHG (chlorhexidine gluconate) soap before surgery. CHG is an antiseptic soap that kills germs and continues to kill germs even after washing.   DO NOT use if you have an allergy to chlorhexidine/CHG or antibacterial soaps. If your skin becomes reddened or irritated, stop using the CHG and notify one of our RNs at [phone]  Please shower with the CHG soap starting 4 days before surgery using the following schedule: SUNDAY  03-25-2024     Do NOT use CHG soap                                                                                                                      the morning of your  surgery.         Please keep in mind the following:  DO NOT shave, including legs and underarms, starting the day of your first shower.   You may shave your face at any point before/day of surgery.  Place clean sheets on your bed the day you start using CHG soap. Use a clean washcloth (not used since being washed) for each shower. DO NOT sleep with pets once you start using the CHG.  CHG Shower Instructions:  If you choose to wash your hair and private area, wash first with your normal shampoo/soap.  After you use shampoo/soap, rinse your  hair and body thoroughly to remove shampoo/soap residue.  Turn the water OFF and apply about 3 tablespoons (45 ml) of CHG soap to a CLEAN washcloth.  Apply CHG soap ONLY FROM YOUR NECK DOWN TO YOUR TOES (washing for 3-5 minutes)  DO NOT use CHG soap on face, private areas, open wounds, or sores.  Pay special attention to the area where your surgery is being performed.  If you are having back surgery, having someone wash your back for you may be helpful. Wait 2 minutes after CHG soap is applied, then you may rinse off the CHG soap.  Pat dry with a clean towel  Put on clean clothes/pajamas   If you choose to wear lotion, please use ONLY the CHG-compatible lotions on the back of this paper.     Additional instructions for the day of surgery: DO NOT APPLY any CHG Soap,  lotions, deodorants, cologne, or perfumes on the day of surgery  Put on clean/comfortable clothes.  Brush your teeth.  Ask your nurse before applying any prescription medications to the skin.   CHG Compatible Lotions   Aveeno Moisturizing lotion  Cetaphil Moisturizing Cream  Cetaphil Moisturizing Lotion  Clairol Herbal Essence Moisturizing Lotion, Dry Skin  Clairol Herbal Essence Moisturizing Lotion, Extra Dry Skin  Clairol Herbal Essence Moisturizing Lotion, Normal Skin  Curel Age Defying Therapeutic Moisturizing Lotion with Alpha Hydroxy  Curel Extreme Care Body Lotion  Curel Soothing Hands Moisturizing Hand Lotion  Curel Therapeutic Moisturizing Cream, Fragrance-Free  Curel Therapeutic Moisturizing Lotion, Fragrance-Free  Curel Therapeutic Moisturizing Lotion, Original Formula  Eucerin Daily Replenishing Lotion  Eucerin Dry Skin Therapy Plus Alpha Hydroxy Crme  Eucerin Dry Skin Therapy Plus Alpha Hydroxy Lotion  Eucerin Original Crme  Eucerin Original Lotion  Eucerin Plus Crme Eucerin Plus Lotion  Eucerin TriLipid Replenishing Lotion  Keri Anti-Bacterial Hand Lotion  Keri Deep Conditioning Original Lotion Dry  Skin Formula Softly Scented  Keri Deep Conditioning Original Lotion, Fragrance Free Sensitive Skin Formula  Keri Lotion Fast Absorbing Fragrance Free Sensitive Skin Formula  Keri Lotion Fast Absorbing Softly Scented Dry Skin Formula  Keri Original Lotion  Keri Skin Renewal Lotion Keri Silky Smooth Lotion  Keri Silky Smooth Sensitive Skin Lotion  Nivea Body Creamy Conditioning Oil  Nivea Body Extra Enriched Lotion  Nivea Body Original Lotion  Nivea Body Sheer Moisturizing Lotion Nivea Crme  Nivea Skin Firming Lotion  NutraDerm 30 Skin Lotion  NutraDerm Skin Lotion  NutraDerm Therapeutic Skin Cream  NutraDerm Therapeutic Skin Lotion  ProShield Protective Hand Cream  Provon moisturizing lotion         Preparing for Total Shoulder Arthroplasty ================================================================= Please follow these instructions carefully, in addition to any other special Bathing information that was explained to you at the Presurgical Appointment:  BENZOYL PEROXIDE 5% GEL: Used to kill bacteria on the skin which could cause an infection  at the surgery site.   Please do not use if you have an allergy to benzoyl peroxide. If your skin becomes reddened/irritated stop using the benzoyl peroxide and inform your Doctor.   Starting two days before surgery, apply as follows:  1. Apply benzoyl peroxide gel in the morning and at night. Apply after taking a shower. If you are not taking a shower, clean entire shoulder front, back, and side, along with the armpit with a clean wet washcloth.  2. Place a quarter-sized dollop of the gel on your SHOULDER and rub in thoroughly, making sure to cover the front, back, and side of your shoulder, along with the armpit.   2 Days prior to Surgery      TUESDAY  03-27-2024 First Application _______ Morning Second Application _______ Night  Day Before Surgery      [facility]  03-28-2024 First Application______ Morning  On the night before  surgery, wash your entire body (except hair, face and private areas) with CHG Soap. THEN, rub in the LAST application of the Benzoyl Peroxide Gel on your shoulder.   3.   DO NOT USE THE BENZOYL PEROXIDE GEL OR CHG SOAP ON THE DAY OF YOUR SURGERY        FAILURE TO FOLLOW THESE INSTRUCTIONS MAY RESULT IN THE CANCELLATION OF YOUR SURGERY  PATIENT SIGNATURE_________________________________  NURSE SIGNATURE__________________________________  ________________________________________________________________________        Marcus Webb    An incentive spirometer is a tool that can help keep your lungs clear and active. This tool measures how well you are filling your lungs with each breath. Taking long deep breaths may help reverse or decrease the chance of developing breathing (pulmonary) problems (especially infection) following: A long period of time when you are unable to move or be active. BEFORE THE PROCEDURE  If the spirometer includes an indicator to show your best effort, your nurse or respiratory therapist will set it to a desired goal. If possible, sit up straight or lean slightly forward. Try not to slouch. Hold the incentive spirometer in an upright position. INSTRUCTIONS FOR USE  Sit on the edge of your bed if possible, or sit up as far as you can in bed or on a chair. Hold the incentive spirometer in an upright position. Breathe out normally. Place the mouthpiece in your mouth and seal your lips tightly around it. Breathe in slowly and as deeply as possible, raising the piston or the ball toward the top of the column. Hold your breath for 3-5 seconds or for as long as possible. Allow the piston or ball to fall to the bottom of the column. Remove the mouthpiece from your mouth and breathe out normally. Rest for a few seconds and repeat Steps 1 through 7 at least 10 times every 1-2 hours when you are awake. Take your time and take a few normal breaths between deep  breaths. The spirometer may include an indicator to show your best effort. Use the indicator as a goal to work toward during each repetition. After each set of 10 deep breaths, practice coughing to be sure your lungs are clear. If you have an incision (the cut made at the time of surgery), support your incision when coughing by placing a pillow or rolled up towels firmly against it. Once you are able to get out of bed, walk around indoors and cough well. You may stop using the incentive spirometer when instructed by your caregiver.  RISKS AND COMPLICATIONS Take your time so  you do not get dizzy or light-headed. If you are in pain, you may need to take or ask for pain medication before doing incentive spirometry. It is harder to take a deep breath if you are having pain. AFTER USE Rest and breathe slowly and easily. It can be helpful to keep track of a log of your progress. Your caregiver can provide you with a simple table to help with this. If you are using the spirometer at home, follow these instructions: SEEK MEDICAL CARE IF:  You are having difficultly using the spirometer. You have trouble using the spirometer as often as instructed. Your pain medication is not giving enough relief while using the spirometer. You develop fever of 100.5 F (38.1 C) or higher.                                                                                                    SEEK IMMEDIATE MEDICAL CARE IF:  You cough up bloody sputum that had not been present before. You develop fever of 102 F (38.9 C) or greater. You develop worsening pain at or near the incision site. MAKE SURE YOU:  Understand these instructions. Will watch your condition. Will get help right away if you are not doing well or get worse. Document Released: 06/28/2006 Document Revised: 05/10/2011 Document Reviewed: 08/29/2006 [facility] Patient Information 2014 [place], [place].       If you would like to see a video about joint  replacement:   [email or web address]

## 2024-03-23 ENCOUNTER — Encounter (HOSPITAL_COMMUNITY): Payer: Self-pay

## 2024-03-23 NOTE — Anesthesia Preprocedure Evaluation (Signed)
"                                    Anesthesia Evaluation  Patient identified by MRN, date of birth, ID band Patient awake    Reviewed: Allergy & Precautions, NPO status , Patient's Chart, lab work & pertinent test results, reviewed documented beta blocker date and time   History of Anesthesia Complications Negative for: history of anesthetic complications  Airway Mallampati: II  TM Distance: >3 FB Neck ROM: Full    Dental  (+) Dental Advisory Given   Pulmonary former smoker   breath sounds clear to auscultation       Cardiovascular hypertension, Pt. on medications and Pt. 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 1. LV EF 59 %. The left ventricle has normal function. The left ventricle has no regional wall motion abnormalities. 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 Grade II diastolic dysfunction (pseudonormalization). Elevated left atrial pressure.   2. RVFis normal. The right ventricular size is mildly enlarged. There is normal pulmonary artery systolic pressure. The estimated right ventricular systolic pressure is 21.3 mmHg.   3. Left atrial size was moderately dilated.   4. Right atrial size was mildly dilated.   5. The mitral valve is degenerative. Trivial mitral valve regurgitation.   6. The inferior vena cava is normal in size with greater than 50%  respiratory variability, suggesting right atrial pressure of 3 mmHg.   7. The aortic valve is calcified. Aortic valve regurgitation is not visualized. Moderate aortic valve stenosis. Vmax 3.0 m/s, MG , AVA 1.6cm^2, DI 0.42. Appears moderate AS, though AV gradients only measured from apical 5C view     Neuro/Psych negative neurological ROS     GI/Hepatic Neg liver ROS,GERD  Controlled and  Medicated,,  Endo/Other  BMI 33  Renal/GU negative Renal ROS     Musculoskeletal   Abdominal   Peds  Hematology Hb 13.4, plt 180k   Anesthesia Other Findings   Reproductive/Obstetrics                              Anesthesia Physical Anesthesia Plan  ASA: 3  Anesthesia Plan: General   Post-op Pain Management: Tylenol  PO (pre-op)* and Regional block*   Induction: Intravenous  PONV Risk Score and Plan: 2 and Ondansetron  and Dexamethasone   Airway Management Planned: Oral ETT  Additional Equipment: ClearSight  Intra-op Plan:   Post-operative Plan: Extubation in OR  Informed Consent: I have reviewed the patients History and Physical, chart, labs and discussed the procedure including the risks, benefits and alternatives for the proposed anesthesia with the patient or authorized representative who has indicated his/her understanding and acceptance.     Dental advisory given  Plan Discussed with: CRNA and Surgeon  Anesthesia Plan Comments: (See PAT note from 1/21 Plan routine monitors, GET with interscalene block for post op analgesia)         Anesthesia Quick Evaluation  "

## 2024-03-23 NOTE — Progress Notes (Signed)
 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  Patient follows with Cardiology for aortic stenosis which is moderate by last Echo in September 2025 with mean gradient of . Has a AAA measuring 4.4cm at mid aorta on duplex in September 2025. Also with history of CAD. No cath report available however Dr. Jeffrie note indicates he has a stent. Last seen by Dr. Jeffrie on 01/17/24 for routine f/u. Noted to be doing well, no cardiac symptoms. Advised to continue current medicines and f/u in 1 year. Cardiac clearance in 12/18 telephone note:  Chart reviewed as part of pre-operative protocol coverage. [name]. was last seen on 01/17/2024 by Dr. Jeffrie.  Per Dr. Jeffrie He may proceed with shoulder surgery with moderate overall cardiac risk. He was able to complete greater than 4 METS of activity prior to his injury. He has moderate aortic stenosis. Therefore, based on ACC/AHA guidelines, the patient would be an acceptable risk for the planned procedure without further cardiovascular testing.     VS: BP (!) 172/82 Comment: right arm sitting  Pulse (!) 58   Temp 37 C   Resp 20   Ht 5' 10 (1.778 m)   Wt 104.3 kg   SpO2 97%   BMI 33.00 kg/m   PROVIDERS: Chandra Toribio POUR, MD   LABS: Labs reviewed: Acceptable for surgery. (all labs ordered are listed, but only abnormal results are displayed)  Labs Reviewed  CBC - Abnormal; Notable for the following components:      Result Value   RBC 4.16 (*)    All other components within normal  limits  SURGICAL PCR SCREEN  BASIC METABOLIC PANEL WITH GFR     CXR 03/21/24:  FINDINGS: The cardiomediastinal contours are normal. The lungs are clear. Pulmonary vasculature is normal. No consolidation, pleural effusion, or pneumothorax. No acute osseous abnormalities are seen.   IMPRESSION: No active cardiopulmonary disease.  EKG 01/17/24:  SR with sinus arrhythmia with 1st degree AVB  Echo 11/25/23:  IMPRESSIONS    1. Left ventricular ejection fraction, by estimation, is 55 to 60%. Left ventricular ejection fraction by 3D volume is 59 %. The left ventricle has normal function. The left ventricle has no regional wall motion abnormalities. 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 Left ventricular diastolic parameters are consistent with Grade II diastolic dysfunction (pseudonormalization). Elevated left atrial pressure.  2. Right ventricular systolic function is normal. The right ventricular size is mildly enlarged. There is normal pulmonary artery systolic pressure. The estimated right ventricular systolic pressure is 21.3 mmHg.  3. Left atrial size was moderately dilated.  4. Right atrial size was mildly dilated.  5. The mitral valve is degenerative. Trivial mitral valve regurgitation.  6. The inferior vena cava is normal in size with greater than 50% respiratory variability, suggesting right atrial pressure of 3 mmHg.  7. The aortic valve is calcified. Aortic valve regurgitation is not visualized. Moderate aortic valve stenosis. Vmax 3.0 m/s, MG , AVA 1.6cm^2, DI 0.42. Appears moderate AS,  though AV gradients only measured from apical 5C view  VAS US  AAA Duplex 11/25/23:  Abdominal Aorta Findings: +-----------+-------+----------+----------+--------+--------+--------+ Location   AP (cm)Trans (cm)PSV  (cm/s)WaveformThrombusComments +-----------+-------+----------+----------+--------+--------+--------+ Proximal   3.01   2.67      59                                 +-----------+-------+----------+----------+--------+--------+--------+ Mid        3.51   4.40      56                                 +-----------+-------+----------+----------+--------+--------+--------+ Distal     2.50   2.38      62                                 +-----------+-------+----------+----------+--------+--------+--------+ RT CIA Prox1.4    1.3       75                                 +-----------+-------+----------+----------+--------+--------+--------+ LT CIA Prox1.3    1.4       130                                +-----------+-------+----------+----------+--------+--------+--------+  Summary: Abdominal Aorta: There is evidence of abnormal dilatation of the proximal and mid Abdominal aorta.   *See table(s) above for measurements and observations.   Past Medical History:  Diagnosis Date   Anemia    Arthritis    GERD (gastroesophageal reflux disease)    Heart murmur    last ECHO 11-25-2023   Hypertension    Pneumonia    Pre-diabetes     Past Surgical History:  Procedure Laterality Date   COLONOSCOPY W/ POLYPECTOMY     x 3   TONSILLECTOMY  1955    MEDICATIONS:  aspirin  EC 81 MG tablet   carvedilol  (COREG ) 12.5 MG tablet   lisinopril -hydrochlorothiazide  (ZESTORETIC ) 20-25 MG tablet   oxyCODONE  (OXY IR/ROXICODONE ) 5 MG immediate release tablet   pantoprazole  (PROTONIX ) 40 MG tablet   rosuvastatin  (CRESTOR ) 20 MG tablet   No current facility-administered medications for this encounter.   [name] Short Stay/Anesthesiology [facility] Phone [phone] 03/23/2024 8:36 AM         "

## 2024-03-29 ENCOUNTER — Other Ambulatory Visit: Payer: Self-pay

## 2024-03-29 ENCOUNTER — Encounter (HOSPITAL_COMMUNITY): Payer: Self-pay | Admitting: Medical

## 2024-03-29 ENCOUNTER — Ambulatory Visit (HOSPITAL_COMMUNITY): Admitting: Anesthesiology

## 2024-03-29 ENCOUNTER — Other Ambulatory Visit (HOSPITAL_COMMUNITY): Payer: Self-pay

## 2024-03-29 ENCOUNTER — Encounter (HOSPITAL_COMMUNITY): Payer: Self-pay | Admitting: Orthopedic Surgery

## 2024-03-29 ENCOUNTER — Ambulatory Visit (HOSPITAL_COMMUNITY)
Admission: RE | Admit: 2024-03-29 | Discharge: 2024-03-29 | Disposition: A | Discharge status: Home / Self Care | Source: Ambulatory Visit | Attending: Orthopedic Surgery | Admitting: Orthopedic Surgery

## 2024-03-29 ENCOUNTER — Encounter (HOSPITAL_COMMUNITY)
Admission: RE | Disposition: A | Discharge status: Home / Self Care | Payer: Self-pay | Source: Ambulatory Visit | Attending: Orthopedic Surgery

## 2024-03-29 DIAGNOSIS — M75102 Unspecified rotator cuff tear or rupture of left shoulder, not specified as traumatic: Secondary | ICD-10-CM

## 2024-03-29 DIAGNOSIS — W19XXXA Unspecified fall, initial encounter: Secondary | ICD-10-CM | POA: Insufficient documentation

## 2024-03-29 DIAGNOSIS — Z87891 Personal history of nicotine dependence: Secondary | ICD-10-CM | POA: Diagnosis not present

## 2024-03-29 DIAGNOSIS — M25512 Pain in left shoulder: Secondary | ICD-10-CM | POA: Insufficient documentation

## 2024-03-29 DIAGNOSIS — I10 Essential (primary) hypertension: Secondary | ICD-10-CM | POA: Diagnosis not present

## 2024-03-29 DIAGNOSIS — I714 Abdominal aortic aneurysm, without rupture, unspecified: Secondary | ICD-10-CM | POA: Insufficient documentation

## 2024-03-29 DIAGNOSIS — S46012A Strain of muscle(s) and tendon(s) of the rotator cuff of left shoulder, initial encounter: Secondary | ICD-10-CM | POA: Insufficient documentation

## 2024-03-29 DIAGNOSIS — I350 Nonrheumatic aortic (valve) stenosis: Secondary | ICD-10-CM | POA: Insufficient documentation

## 2024-03-29 DIAGNOSIS — Z955 Presence of coronary angioplasty implant and graft: Secondary | ICD-10-CM | POA: Insufficient documentation

## 2024-03-29 DIAGNOSIS — M12512 Traumatic arthropathy, left shoulder: Secondary | ICD-10-CM | POA: Diagnosis not present

## 2024-03-29 DIAGNOSIS — M6281 Muscle weakness (generalized): Secondary | ICD-10-CM | POA: Insufficient documentation

## 2024-03-29 DIAGNOSIS — I2510 Atherosclerotic heart disease of native coronary artery without angina pectoris: Secondary | ICD-10-CM | POA: Insufficient documentation

## 2024-03-29 MED ORDER — OXYCODONE HCL 5 MG/5ML PO SOLN: 5.0000 mg | Freq: Once | ORAL | Status: DC | PRN

## 2024-03-29 MED ORDER — BUPIVACAINE LIPOSOME 1.3 % IJ SUSP
INTRAMUSCULAR | Status: DC | PRN
  Administered 2024-03-29: 10 mL via PERINEURAL

## 2024-03-29 MED ORDER — DEXAMETHASONE SOD PHOSPHATE PF 10 MG/ML IJ SOLN
INTRAMUSCULAR | Status: DC | PRN
  Administered 2024-03-29: 5 mg via INTRAVENOUS

## 2024-03-29 MED ORDER — LIDOCAINE HCL (PF) 2 % IJ SOLN
INTRAMUSCULAR | Status: DC | PRN
  Administered 2024-03-29: 20 mg via INTRADERMAL

## 2024-03-29 MED ORDER — EPHEDRINE SULFATE (PRESSORS) 25 MG/5ML IV SOSY
PREFILLED_SYRINGE | INTRAVENOUS | Status: DC | PRN
  Administered 2024-03-29: 2.5 mg via INTRAVENOUS

## 2024-03-29 MED ORDER — ACETAMINOPHEN 500 MG PO TABS
1000.0000 mg | ORAL_TABLET | Freq: Once | ORAL | Status: AC
  Administered 2024-03-29: 1000 mg via ORAL
  Filled 2024-03-29: qty 2

## 2024-03-29 MED ORDER — ORAL CARE MOUTH RINSE: 15.0000 mL | Freq: Once | OROMUCOSAL | Status: AC

## 2024-03-29 MED ORDER — MEPERIDINE HCL 25 MG/ML IJ SOLN: 6.2500 mg | INTRAMUSCULAR | Status: DC | PRN

## 2024-03-29 MED ORDER — OXYCODONE HCL 5 MG PO TABS
2.5000 mg | ORAL_TABLET | ORAL | 0 refills | Status: AC | PRN
  Filled 2024-03-29: qty 20, 4d supply, fill #0

## 2024-03-29 MED ORDER — TRANEXAMIC ACID-NACL 1000-0.7 MG/100ML-% IV SOLN
1000.0000 mg | INTRAVENOUS | Status: AC
  Administered 2024-03-29: 1000 mg via INTRAVENOUS
  Filled 2024-03-29: qty 100

## 2024-03-29 MED ORDER — ROCURONIUM BROMIDE 10 MG/ML (PF) SYRINGE
PREFILLED_SYRINGE | INTRAVENOUS | Status: DC | PRN
  Administered 2024-03-29: 60 mg via INTRAVENOUS
  Administered 2024-03-29: 10 mg via INTRAVENOUS

## 2024-03-29 MED ORDER — PROPOFOL 10 MG/ML IV BOLUS
INTRAVENOUS | Status: AC
  Filled 2024-03-29: qty 20

## 2024-03-29 MED ORDER — POVIDONE-IODINE 7.5 % EX SOLN: 1.0000 | Freq: Once | CUTANEOUS | Status: DC

## 2024-03-29 MED ORDER — PHENYLEPHRINE HCL-NACL 20-0.9 MG/250ML-% IV SOLN
INTRAVENOUS | Status: DC | PRN
  Administered 2024-03-29: 20 ug/min via INTRAVENOUS

## 2024-03-29 MED ORDER — DEXAMETHASONE SOD PHOSPHATE PF 10 MG/ML IJ SOLN
INTRAMUSCULAR | Status: AC
  Filled 2024-03-29: qty 1

## 2024-03-29 MED ORDER — GLYCOPYRROLATE 0.2 MG/ML IJ SOLN
INTRAMUSCULAR | Status: DC | PRN
  Administered 2024-03-29: .2 mg via INTRAVENOUS

## 2024-03-29 MED ORDER — BUPIVACAINE-EPINEPHRINE (PF) 0.5% -1:200000 IJ SOLN
INTRAMUSCULAR | Status: DC | PRN
  Administered 2024-03-29: 15 mL via PERINEURAL

## 2024-03-29 MED ORDER — WATER FOR IRRIGATION, STERILE IR SOLN
Status: DC | PRN
  Administered 2024-03-29: 2000 mL

## 2024-03-29 MED ORDER — FENTANYL CITRATE (PF) 100 MCG/2ML IJ SOLN
INTRAMUSCULAR | Status: AC
  Filled 2024-03-29: qty 2

## 2024-03-29 MED ORDER — ONDANSETRON HCL 4 MG/2ML IJ SOLN
INTRAMUSCULAR | Status: AC
  Filled 2024-03-29: qty 2

## 2024-03-29 MED ORDER — PROPOFOL 10 MG/ML IV BOLUS
INTRAVENOUS | Status: DC | PRN
  Administered 2024-03-29: 100 mg via INTRAVENOUS

## 2024-03-29 MED ORDER — SODIUM CHLORIDE 0.9 % IR SOLN
Status: DC | PRN
  Administered 2024-03-29: 1000 mL

## 2024-03-29 MED ORDER — ROCURONIUM BROMIDE 10 MG/ML (PF) SYRINGE
PREFILLED_SYRINGE | INTRAVENOUS | Status: AC
  Filled 2024-03-29: qty 10

## 2024-03-29 MED ORDER — FENTANYL CITRATE (PF) 100 MCG/2ML IJ SOLN
INTRAMUSCULAR | Status: DC | PRN
  Administered 2024-03-29: 100 ug via INTRAVENOUS

## 2024-03-29 MED ORDER — SUGAMMADEX SODIUM 200 MG/2ML IV SOLN
INTRAVENOUS | Status: DC | PRN
  Administered 2024-03-29: 200 mg via INTRAVENOUS

## 2024-03-29 MED ORDER — CHLORHEXIDINE GLUCONATE 0.12 % MT SOLN
15.0000 mL | Freq: Once | OROMUCOSAL | Status: AC
  Administered 2024-03-29: 15 mL via OROMUCOSAL

## 2024-03-29 MED ORDER — FENTANYL CITRATE (PF) 50 MCG/ML IJ SOSY
50.0000 ug | PREFILLED_SYRINGE | INTRAMUSCULAR | Status: DC
  Administered 2024-03-29: 50 ug via INTRAVENOUS
  Filled 2024-03-29: qty 1

## 2024-03-29 MED ORDER — HYDROMORPHONE HCL 1 MG/ML IJ SOLN: 0.2500 mg | INTRAMUSCULAR | Status: DC | PRN

## 2024-03-29 MED ORDER — LACTATED RINGERS IV SOLN: INTRAVENOUS | Status: DC

## 2024-03-29 MED ORDER — CEFAZOLIN SODIUM-DEXTROSE 2-4 GM/100ML-% IV SOLN
2.0000 g | INTRAVENOUS | Status: AC
  Administered 2024-03-29: 2 g via INTRAVENOUS
  Filled 2024-03-29: qty 100

## 2024-03-29 MED ORDER — OXYCODONE HCL 5 MG PO TABS: 5.0000 mg | ORAL_TABLET | Freq: Once | ORAL | Status: DC | PRN

## 2024-03-29 MED ORDER — MIDAZOLAM HCL (PF) 2 MG/2ML IJ SOLN: 0.5000 mg | Freq: Once | INTRAMUSCULAR | Status: DC | PRN

## 2024-03-29 MED ORDER — TIZANIDINE HCL 4 MG PO TABS
2.0000 mg | ORAL_TABLET | Freq: Three times a day (TID) | ORAL | 0 refills | Status: AC | PRN
  Filled 2024-03-29: qty 30, 10d supply, fill #0

## 2024-03-29 MED ORDER — ONDANSETRON HCL 4 MG/2ML IJ SOLN
INTRAMUSCULAR | Status: DC | PRN
  Administered 2024-03-29: 4 mg via INTRAVENOUS

## 2024-03-29 MED ORDER — MIDAZOLAM HCL (PF) 2 MG/2ML IJ SOLN
1.0000 mg | INTRAMUSCULAR | Status: DC
  Administered 2024-03-29: 1 mg via INTRAVENOUS
  Filled 2024-03-29: qty 2

## 2024-03-29 MED ORDER — PHENYLEPHRINE 80 MCG/ML (10ML) SYRINGE FOR IV PUSH (FOR BLOOD PRESSURE SUPPORT)
PREFILLED_SYRINGE | INTRAVENOUS | Status: AC
  Filled 2024-03-29: qty 10

## 2024-03-29 NOTE — Transfer of Care (Signed)
 Immediate Anesthesia Transfer of Care Note  Patient: Marcus Webb.  Procedure(s) Performed: ARTHROPLASTY, SHOULDER, TOTAL, REVERSE (Left: Shoulder)  Patient Location: PACU  Anesthesia Type:General and Regional  Level of Consciousness: awake, alert , oriented, and patient cooperative  Airway & Oxygen Therapy: Patient Spontanous Breathing and Patient connected to face mask oxygen  Post-op Assessment: Report given to RN and Post -op Vital signs reviewed and stable  Post vital signs: Reviewed and stable  Last Vitals:  Vitals Value Taken Time  BP 153/88 03/29/24 11:06  Temp    Pulse 72 03/29/24 11:09  Resp 14 03/29/24 11:09  SpO2 97 % 03/29/24 11:09  Vitals shown include unfiled device data.  Last Pain:  Vitals:   03/29/24 0910  TempSrc:   PainSc: 0-No pain         Complications: No notable events documented.

## 2024-03-29 NOTE — H&P (Signed)
 Marcus Webb. is an 79 y.o. male.   Chief Complaint: Left shoulder pain and dysfunction HPI: Status post fall with left shoulder massive rotator cuff tear and 79 year old male.  Past Medical History:  Diagnosis Date   AAA (abdominal aortic aneurysm)    Anemia    Aortic stenosis    Arthritis    CAD (coronary artery disease)    s/p stent   GERD (gastroesophageal reflux disease)    Heart murmur    last ECHO 11-25-2023   Hypertension    Pneumonia    Pre-diabetes     Past Surgical History:  Procedure Laterality Date   COLONOSCOPY W/ POLYPECTOMY     x 3   TONSILLECTOMY  1955    History reviewed. No pertinent family history. Social History:  reports that he has quit smoking. His smoking use included cigarettes. He has never been exposed to tobacco smoke. He has never used smokeless tobacco. He reports that he does not currently use alcohol. He reports that he does not use drugs.  Allergies: Allergies[1]  Medications Prior to Admission  Medication Sig Dispense Refill   aspirin  EC 81 MG tablet Take 1 tablet (81 mg total) by mouth daily. Swallow whole. 90 tablet 3   carvedilol  (COREG ) 12.5 MG tablet Take 1 tablet by mouth twice daily 180 tablet 3   lisinopril -hydrochlorothiazide  (ZESTORETIC ) 20-25 MG tablet Take 1 tablet by mouth daily. 90 tablet 3   pantoprazole  (PROTONIX ) 40 MG tablet Take 1 tablet (40 mg total) by mouth 2 (two) times daily. 180 tablet 3   rosuvastatin  (CRESTOR ) 20 MG tablet Take 1 tablet by mouth once daily 90 tablet 3   oxyCODONE  (OXY IR/ROXICODONE ) 5 MG immediate release tablet Take 0.5-1 tablets (2.5-5 mg total) by mouth every 6 (six) hours as needed for severe pain (pain score 7-10) or breakthrough pain. 20 tablet 0    No results found for this or any previous visit (from the past 48 hours). No results found.  Review of Systems  All other systems reviewed and are negative.   Blood pressure (!) 154/79, pulse 60, temperature 98.2 F (36.8 C),  temperature source Oral, resp. rate 16, height 5' 10 (1.778 m), weight 104.3 kg, SpO2 96%. Physical Exam Constitutional:      Appearance: He is well-developed.  HENT:     Head: Atraumatic.  Eyes:     Extraocular Movements: Extraocular movements intact.  Cardiovascular:     Pulses: Normal pulses.  Pulmonary:     Effort: Pulmonary effort is normal.  Skin:    General: Skin is warm and dry.  Neurological:     Mental Status: He is alert and oriented to person, place, and time.  Psychiatric:        Mood and Affect: Mood normal.      Assessment/Plan Status post fall with left shoulder massive rotator cuff tear and 79 year old male. Plan left reverse total shoulder arthroplasty Risks / benefits of surgery discussed Consent on chart  NPO for OR Preop antibiotics   Josefa LELON Herring, MD 03/29/2024, 8:54 AM       [1] No Known Allergies

## 2024-03-29 NOTE — Evaluation (Signed)
 Occupational Therapy Evaluation Patient Details Name: Marcus Webb. MRN: [id number] DOB: Jul 11, 1945 Today's Date: 03/29/2024   History of Present Illness   Pt is s/p ARTHROPLASTY, SHOULDER, TOTAL, REVERSE (Left: Shoulder) .     Clinical Impressions Patient is s/p shoulder surgery resulting in the deficits listed below (see OT Problem List).  Patient will benefit from acute skilled OT to increase their safety and independence with ADL and functional mobility for ADL (while adhering to their precautions) to facilitate discharge .      If plan is discharge home, recommend the following:   A little help with walking and/or transfers;A little help with bathing/dressing/bathroom     Functional Status Assessment   Patient has had a recent decline in their functional status and demonstrates the ability to make significant improvements in function in a reasonable and predictable amount of time.     Equipment Recommendations   None recommended by OT      Precautions/Restrictions   Precautions Precautions: Shoulder Precaution/Restrictions Comments: Sling at all times except ADL/exercise: Yes Non weight bearing: Yes AROM elbow, wrist and hand to tolerance: Yes PROM of shoulder: No AROM of shoulder: No Required Braces or Orthoses: Sling     Mobility Bed Mobility            Pt in chair        Transfers Overall transfer level: Needs assistance   Transfers: Sit to/from Stand Sit to Stand: Min assist                      ADL either performed or assessed with clinical judgement      Vision Patient Visual Report: No change from baseline              Pertinent Vitals/Pain Pain Assessment Pain Assessment: No/denies pain        Communication     Cognition Arousal: Alert Behavior During Therapy: WFL for tasks assessed/performed Cognition: No apparent impairments                                             Shoulder  Instructions Shoulder Instructions Donning/doffing shirt without moving shoulder: Minimal assistance;Caregiver independent with task;Patient able to independently direct caregiver Method for sponge bathing under operated UE: Patient able to independently direct caregiver;Minimal assistance;Caregiver independent with task Donning/doffing sling/immobilizer: Minimal assistance;Caregiver independent with task Correct positioning of sling/immobilizer: Minimal assistance;Caregiver independent with task Sling wearing schedule (on at all times/off for ADL's): Minimal assistance;Caregiver independent with task;Patient able to independently direct caregiver Proper positioning of operated UE when showering: Minimal assistance;Caregiver independent with task;Patient able to independently direct caregiver Positioning of UE while sleeping: Patient able to independently direct caregiver;Minimal assistance;Caregiver independent with task    Home Living Family/patient expects to be discharged to:: Private residence Living Arrangements: Spouse/significant other                                      Prior Functioning/Environment Prior Level of Function : Independent/Modified Independent                    OT Problem List: Decreased strength;Decreased range of motion        OT Goals(Current goals can be found in the care plan section)   Acute Rehab  OT Goals Patient Stated Goal: home today OT Goal Formulation: With patient/family Potential to Achieve Goals: Good         AM-PAC OT 6 Clicks Daily Activity     Outcome Measure Help from another person eating meals?: A Little Help from another person taking care of personal grooming?: A Lot Help from another person toileting, which includes using toliet, bedpan, or urinal?: A Lot Help from another person bathing (including washing, rinsing, drying)?: A Lot Help from another person to put on and taking off regular upper body  clothing?: A Lot Help from another person to put on and taking off regular lower body clothing?: A Lot 6 Click Score: 13   End of Session Nurse Communication: Mobility status  Activity Tolerance: Patient tolerated treatment well Patient left: in chair;with call bell/phone within reach;with family/visitor present  OT Visit Diagnosis: Muscle weakness (generalized) (M62.81)                Time: 8749-8674 OT Time Calculation (min): 35 min Charges:  OT General Charges $OT Visit: 1 Visit OT Evaluation $OT Eval Low Complexity: 1 Low OT Treatments $Self Care/Home Management : 23-37 mins    Ericca Labra, Norvel BIRCH 03/29/2024, 3:23 PM

## 2024-03-29 NOTE — Discharge Instructions (Addendum)
Discharge Instructions after Reverse Total Shoulder Arthroplasty   A sling has been provided for you. You are to wear this at all times (except for bathing and dressing), until your first post operative visit with Dr. Ave Filter. Please also wear while sleeping at night. While you bath and dress, let the arm/elbow extend straight down to stretch your elbow. Wiggle your fingers and pump your first while your in the sling to prevent hand swelling. Use ice on the shoulder intermittently over the first 48 hours after surgery. Continue to use ice or and ice machine as needed after 48 hours for pain control/swelling.  Pain medicine has been prescribed for you.  Use your medicine liberally over the first 48 hours, and then you can begin to taper your use. You may take Extra Strength Tylenol or Tylenol only in place of the pain pills. DO NOT take ANY nonsteroidal anti-inflammatory pain medications: Advil, Motrin, Ibuprofen, Aleve, Naproxen or Naprosyn.  Resume aspirin the day after surgery  Leave your dressing on until your first follow up visit.  You may shower with the dressing.  Hold your arm as if you still have your sling on while you shower. Simply allow the water to wash over the site and then pat dry. Make sure your axilla (armpit) is completely dry after showering.    Please call [phone] during normal business hours or [phone] after hours for any problems. Including the following:  - excessive redness of the incisions - drainage for more than 4 days - fever of more than 101.5 F  *Please note that pain medications will not be refilled after hours or on weekends.    Dental Antibiotics:  In most cases prophylactic antibiotics for Dental procdeures after total joint surgery are not necessary.  Exceptions are as follows:  1. History of prior total joint infection  2. Severely immunocompromised (Organ Transplant, cancer chemotherapy, Rheumatoid biologic meds such as Humera)  3.  Poorly controlled diabetes (A1C &gt; 8.0, blood glucose over 200)  If you have one of these conditions, contact your surgeon for an antibiotic prescription, prior to your dental procedure.

## 2024-03-29 NOTE — Anesthesia Postprocedure Evaluation (Signed)
"   Anesthesia Post Note  Patient: Marcus Webb.  Procedure(s) Performed: ARTHROPLASTY, SHOULDER, TOTAL, REVERSE (Left: Shoulder)     Patient location during evaluation: PACU Anesthesia Type: General Level of consciousness: awake and alert, oriented and patient cooperative Pain management: pain level controlled Vital Signs Assessment: post-procedure vital signs reviewed and stable Respiratory status: spontaneous breathing, nonlabored ventilation and respiratory function stable Cardiovascular status: blood pressure returned to baseline and stable Postop Assessment: no apparent nausea or vomiting and able to ambulate Anesthetic complications: no   No notable events documented.  Last Vitals:  Vitals:   03/29/24 1230 03/29/24 1246  BP: 122/76 (!) 144/94  Pulse: 64 80  Resp: 12   Temp:    SpO2: 93% 96%    Last Pain:  Vitals:   03/29/24 1246  TempSrc:   PainSc: 0-No pain                 Ashly Yepez,E. Tarek Cravens      "

## 2024-03-29 NOTE — Anesthesia Procedure Notes (Signed)
 Procedure Name: Intubation Date/Time: 03/29/2024 9:56 AM  Performed by: Franchot Delon RAMAN, CRNAPre-anesthesia Checklist: Patient identified, Emergency Drugs available, Suction available and Patient being monitored Patient Re-evaluated:Patient Re-evaluated prior to induction Oxygen Delivery Method: Circle System Utilized Preoxygenation: Pre-oxygenation with 100% oxygen Induction Type: IV induction Ventilation: Mask ventilation without difficulty Laryngoscope Size: Mac and 4 Grade View: Grade I Tube type: Oral Tube size: 7.5 mm Number of attempts: 1 Airway Equipment and Method: Stylet Placement Confirmation: ETT inserted through vocal cords under direct vision, positive ETCO2 and breath sounds checked- equal and bilateral Secured at: 23 cm Tube secured with: Tape Dental Injury: Teeth and Oropharynx as per pre-operative assessment

## 2024-03-29 NOTE — Anesthesia Procedure Notes (Signed)
 Anesthesia Regional Block: Interscalene brachial plexus block   Pre-Anesthetic Checklist: , timeout performed,  Correct Patient, Correct Site, Correct Laterality,  Correct Procedure, Correct Position, site marked,  Risks and benefits discussed,  Surgical consent,  Pre-op evaluation,  At surgeon's request and post-op pain management  Laterality: Left and Upper  Prep: chloraprep       Needles:  Injection technique: Single-shot  Needle Type: Echogenic Needle     Needle Length: 9cm  Needle Gauge: 21     Additional Needles:   Procedures:,,,, ultrasound used (permanent image in chart),,    Narrative:  Start time: 03/29/2024 8:57 AM End time: 03/29/2024 9:03 AM Injection made incrementally with aspirations every 5 mL.  Performed by: Personally  Anesthesiologist: Leonce Athens, MD  Additional Notes: Pt identified in Holding room.  Monitors applied. Working IV access confirmed. Timeout, Sterile prep L clavicle and neck.  #21ga ECHOgenic Arrow block needle to interscalene brachial plexus with US  guidance.  15cc 0.5% Bupivacaine  1:200k epi, exparel  injected incrementally after negative test dose.  Patient asymptomatic, VSS, no heme aspirated, tolerated well.   JAYSON Leonce, MD

## 2024-03-29 NOTE — Op Note (Signed)
 Procedures: ARTHROPLASTY, SHOULDER, TOTAL, REVERSE Procedure Note  Marcus Webb. male 79 y.o. 03/29/2024  Preoperative diagnosis: Left shoulder irreparable rotator cuff tear  Postoperative diagnosis: Same  Procedure performed: Left reverse total shoulder arthroplasty   Indications:  79 y.o. male  With left shoulder irrepairable rotator cuff tear. Pain and dysfunction interfered with quality of life and nonoperative treatment with activity modification, NSAIDS and injections failed.     Surgeon: Josefa LELON Herring   Assistants: Jeoffrey Northern PA-C Amber was present and scrubbed throughout the procedure and was essential in positioning, retraction, exposure, and closure)  Anesthesia: General endotracheal anesthesia with preoperative interscalene block given by the attending anesthesiologist    Procedure Detail  ARTHROPLASTY, SHOULDER, TOTAL, REVERSE   Estimated Blood Loss:  200 mL         Drains: none  Blood Given: none          Specimens: none        Complications:  * No complications entered in OR log *         Disposition: PACU - hemodynamically stable.         Condition: stable      OPERATIVE FINDINGS:  A DJO Altivate pressfit reverse total shoulder arthroplasty was placed with a  size 12 stem, a 36 standard glenosphere, and a +4-mm poly insert. The base plate  fixation was excellent.  PROCEDURE: The patient was identified in the preoperative holding area  where I personally marked the operative site after verifying site, side,  and procedure with the patient. An interscalene block given by  the attending anesthesiologist in the holding area and the patient was taken back to the operating room where all extremities were  carefully padded in position after general anesthesia was induced. She  was placed in a beach-chair position and the operative upper extremity was  prepped and draped in a standard sterile fashion. An approximately 10-  cm incision was  made from the tip of the coracoid process to the center  point of the humerus at the level of the axilla. Dissection was carried  down through subcutaneous tissues to the level of the cephalic vein  which was taken laterally with the deltoid. The pectoralis major was  retracted medially. The subdeltoid space was developed and the lateral  edge of the conjoined tendon was identified. The undersurface of  conjoined tendon was palpated and the musculocutaneous nerve was not in  the field. Retractor was placed underneath the conjoined and second  retractor was placed lateral into the deltoid. The circumflex humeral  artery and vessels were identified and clamped and coagulated. The  biceps tendon was tenodesed to the upper border the pectoralis.  The subscapularis was intact and taken down as a peel.  The  joint was then gently externally rotated while the capsule was released  from the humeral neck around to just beyond the 6 o'clock position. At  this point, the joint was dislocated and the humeral head was presented  into the wound. The excessive osteophyte formation was removed with a  large rongeur.  The cutting guide was used to make the appropriate  head cut and the head was saved for potentially bone grafting.  The glenoid was exposed with the arm in an  abducted extended position. The anterior and posterior labrum were  completely excised and the capsule was released circumferentially to  allow for exposure of the glenoid for preparation. The 2.5 mm drill was  placed using the guide  in 5-10 inferior angulation and the tap was then advanced in the same hole. Small and large reamers were then used. The tap was then removed and the Metaglene was then screwed in with excellent purchase.  The peripheral guide was then used to drilled measured and filled peripheral locking screws. The size 36 standard glenosphere was then impacted on the [facility] taper and the central screw was placed. The humerus  was then again exposed and the diaphyseal reamers were used followed by the metaphyseal reamers. The final broach was left in place in the proximal trial was placed. The joint was reduced and with this implant it was felt that soft tissue tensioning was appropriate with excellent stability and excellent range of motion. Therefore, final humeral stem was placed press-fit.  And then the trial polyethylene inserts were tested again and the above implant was felt to be the most appropriate for final insertion. The joint was reduced taken through full range of motion and felt to be stable. Soft tissue tension was appropriate.  The joint was then copiously irrigated with pulse  lavage and the wound was then closed. The subscapularis was repaired with 2 labral tapes previously passed through bone tunnels and around the implant..  Skin was closed with 2-0 Vicryl in a deep dermal layer and 4-0  Monocryl for skin closure. Steri-Strips were applied. Sterile  dressings were then applied as well as a sling. The patient was allowed  to awaken from general anesthesia, transferred to stretcher, and taken  to recovery room in stable condition.   POSTOPERATIVE PLAN: The patient will be observed in the recovery room and if pain is well-controlled with regional anesthesia and he is hemodynamically stable he can be discharged home today with family.

## 2024-03-30 ENCOUNTER — Encounter (HOSPITAL_COMMUNITY): Payer: Self-pay | Admitting: Orthopedic Surgery

## 2024-05-17 ENCOUNTER — Ambulatory Visit

## 2024-05-31 ENCOUNTER — Other Ambulatory Visit

## 2024-06-07 ENCOUNTER — Encounter: Admitting: Family Medicine

## 2024-12-03 ENCOUNTER — Ambulatory Visit (HOSPITAL_COMMUNITY)

## 2024-12-10 ENCOUNTER — Ambulatory Visit (HOSPITAL_COMMUNITY)
